# Patient Record
Sex: Male | Born: 1935 | Race: Black or African American | Hispanic: No | Marital: Married | Smoking: Former smoker
Health system: Southern US, Community
[De-identification: ages and names within clinical notes are randomized; demographics above are authoritative.]

## PROBLEM LIST (undated history)

## (undated) DIAGNOSIS — B019 Varicella without complication: Secondary | ICD-10-CM

## (undated) DIAGNOSIS — C61 Malignant neoplasm of prostate: Secondary | ICD-10-CM

## (undated) DIAGNOSIS — I639 Cerebral infarction, unspecified: Secondary | ICD-10-CM

## (undated) DIAGNOSIS — G20A1 Parkinson's disease without dyskinesia, without mention of fluctuations: Secondary | ICD-10-CM

## (undated) DIAGNOSIS — G20 Parkinson's disease: Secondary | ICD-10-CM

## (undated) HISTORY — DX: Malignant neoplasm of prostate: C61

## (undated) HISTORY — DX: Parkinson's disease: G20

## (undated) HISTORY — DX: Varicella without complication: B01.9

## (undated) HISTORY — PX: WISDOM TOOTH EXTRACTION: SHX21

## (undated) HISTORY — DX: Cerebral infarction, unspecified: I63.9

## (undated) HISTORY — DX: Parkinson's disease without dyskinesia, without mention of fluctuations: G20.A1

---

## 1998-06-24 ENCOUNTER — Ambulatory Visit (HOSPITAL_COMMUNITY): Admission: RE | Admit: 1998-06-24 | Discharge: 1998-06-24 | Payer: Self-pay | Admitting: Orthopaedic Surgery

## 1999-03-02 ENCOUNTER — Encounter: Admission: RE | Admit: 1999-03-02 | Discharge: 1999-05-31 | Payer: Self-pay | Admitting: Anesthesiology

## 2002-05-15 ENCOUNTER — Ambulatory Visit (HOSPITAL_COMMUNITY): Admission: RE | Admit: 2002-05-15 | Discharge: 2002-05-15 | Payer: Self-pay | Admitting: Gastroenterology

## 2009-12-12 HISTORY — PX: SINUS ENDO W/FUSION: SHX777

## 2010-12-12 DIAGNOSIS — I639 Cerebral infarction, unspecified: Secondary | ICD-10-CM

## 2010-12-12 HISTORY — DX: Cerebral infarction, unspecified: I63.9

## 2013-04-01 ENCOUNTER — Other Ambulatory Visit (HOSPITAL_COMMUNITY): Payer: Self-pay | Admitting: Urology

## 2013-04-01 DIAGNOSIS — C61 Malignant neoplasm of prostate: Secondary | ICD-10-CM

## 2013-04-18 ENCOUNTER — Encounter (HOSPITAL_COMMUNITY): Payer: Self-pay

## 2013-08-02 ENCOUNTER — Encounter: Payer: Self-pay | Admitting: Podiatry

## 2013-08-02 ENCOUNTER — Ambulatory Visit (INDEPENDENT_AMBULATORY_CARE_PROVIDER_SITE_OTHER): Payer: Medicare Other | Admitting: Podiatry

## 2013-08-02 DIAGNOSIS — B351 Tinea unguium: Secondary | ICD-10-CM

## 2013-08-02 DIAGNOSIS — M25579 Pain in unspecified ankle and joints of unspecified foot: Secondary | ICD-10-CM

## 2013-08-02 DIAGNOSIS — R609 Edema, unspecified: Secondary | ICD-10-CM | POA: Insufficient documentation

## 2013-08-02 NOTE — Progress Notes (Signed)
Subjective: 79 year old male presents accompanied by his wife, complaining of pain on both feet, swelling and thick toe nails.  Medication, allergies, past histories reviewed and noted.  Objective: Pedal edema pitting 2+ left foot and leg, 1+ right foot and leg. Hallux valgus with bunion L>R. Thick dystrophic and yellow nails x 10. Pedal pulses are palpable on DP and not palpable on PT.  Assessment: Edema lower limb L>R. Hypertrophic Mycotic nails x 10. Painful feet bilateral.  Plan: Advised to wear compression socks during the day. Debrided all nails. Return in 3 month or as needed.

## 2013-08-05 ENCOUNTER — Encounter: Payer: Self-pay | Admitting: Physician Assistant

## 2013-08-05 ENCOUNTER — Ambulatory Visit (INDEPENDENT_AMBULATORY_CARE_PROVIDER_SITE_OTHER): Payer: Medicare Other | Admitting: Physician Assistant

## 2013-08-05 VITALS — BP 142/88 | HR 60 | Temp 98.0°F | Resp 14 | Ht 66.0 in | Wt 165.0 lb

## 2013-08-05 DIAGNOSIS — C61 Malignant neoplasm of prostate: Secondary | ICD-10-CM | POA: Insufficient documentation

## 2013-08-05 DIAGNOSIS — G5622 Lesion of ulnar nerve, left upper limb: Secondary | ICD-10-CM

## 2013-08-05 DIAGNOSIS — G20A1 Parkinson's disease without dyskinesia, without mention of fluctuations: Secondary | ICD-10-CM

## 2013-08-05 DIAGNOSIS — G20 Parkinson's disease: Secondary | ICD-10-CM

## 2013-08-05 DIAGNOSIS — R609 Edema, unspecified: Secondary | ICD-10-CM

## 2013-08-05 DIAGNOSIS — G5620 Lesion of ulnar nerve, unspecified upper limb: Secondary | ICD-10-CM

## 2013-08-05 DIAGNOSIS — Z0000 Encounter for general adult medical examination without abnormal findings: Secondary | ICD-10-CM

## 2013-08-05 NOTE — Assessment & Plan Note (Signed)
Patient encouraged to wear compression stockings daily.  Elevate feet while resting.  Return if symptoms persist and worsen.

## 2013-08-05 NOTE — Assessment & Plan Note (Signed)
Managed by Neurologist -- Dr. Pearlie Oyster.  Continue current management

## 2013-08-05 NOTE — Patient Instructions (Signed)
Please come back this week to get your fasting labs.  No food or drink the morning before lab work.  I will call you with results.  Please wear compression hose daily to help with swelling.  Remember to elevate legs above the heart.  Avoid sleeping on left arm.  Please call if you need anything.   Edema Edema is an abnormal build-up of fluids in tissues. Because this is partly dependent on gravity (water flows to the lowest place), it is more common in the legs and thighs (lower extremities). It is also common in the looser tissues, like around the eyes. Painless swelling of the feet and ankles is common and increases as a person ages. It may affect both legs and may include the calves or even thighs. When squeezed, the fluid may move out of the affected area and may leave a dent for a few moments. CAUSES   Prolonged standing or sitting in one place for extended periods of time. Movement helps pump tissue fluid into the veins, and absence of movement prevents this, resulting in edema.  Varicose veins. The valves in the veins do not work as well as they should. This causes fluid to leak into the tissues.  Fluid and salt overload.  Injury, burn, or surgery to the leg, ankle, or foot, may damage veins and allow fluid to leak out.  Sunburn damages vessels. Leaky vessels allow fluid to go out into the sunburned tissues.  Allergies (from insect bites or stings, medications or chemicals) cause swelling by allowing vessels to become leaky.  Protein in the blood helps keep fluid in your vessels. Low protein, as in malnutrition, allows fluid to leak out.  Hormonal changes, including pregnancy and menstruation, cause fluid retention. This fluid may leak out of vessels and cause edema.  Medications that cause fluid retention. Examples are sex hormones, blood pressure medications, steroid treatment, or anti-depressants.  Some illnesses cause edema, especially heart failure, kidney disease, or liver  disease.  Surgery that cuts veins or lymph nodes, such as surgery done for the heart or for breast cancer, may result in edema. DIAGNOSIS  Your caregiver is usually easily able to determine what is causing your swelling (edema) by simply asking what is wrong (getting a history) and examining you (doing a physical). Sometimes x-rays, EKG (electrocardiogram or heart tracing), and blood work may be done to evaluate for underlying medical illness. TREATMENT  General treatment includes:  Leg elevation (or elevation of the affected body part).  Restriction of fluid intake.  Prevention of fluid overload.  Compression of the affected body part. Compression with elastic bandages or support stockings squeezes the tissues, preventing fluid from entering and forcing it back into the blood vessels.  Diuretics (also called water pills or fluid pills) pull fluid out of your body in the form of increased urination. These are effective in reducing the swelling, but can have side effects and must be used only under your caregiver's supervision. Diuretics are appropriate only for some types of edema. The specific treatment can be directed at any underlying causes discovered. Heart, liver, or kidney disease should be treated appropriately. HOME CARE INSTRUCTIONS   Elevate the legs (or affected body part) above the level of the heart, while lying down.  Avoid sitting or standing still for prolonged periods of time.  Avoid putting anything directly under the knees when lying down, and do not wear constricting clothing or garters on the upper legs.  Exercising the legs causes the fluid  to work back into the veins and lymphatic channels. This may help the swelling go down.  The pressure applied by elastic bandages or support stockings can help reduce ankle swelling.  A low-salt diet may help reduce fluid retention and decrease the ankle swelling.  Take any medications exactly as prescribed. SEEK MEDICAL  CARE IF:  Your edema is not responding to recommended treatments. SEEK IMMEDIATE MEDICAL CARE IF:   You develop shortness of breath or chest pain.  You cannot breathe when you lay down; or if, while lying down, you have to get up and go to the window to get your breath.  You are having increasing swelling without relief from treatment.  You develop a fever over 102 F (38.9 C).  You develop pain or redness in the areas that are swollen.  Tell your caregiver right away if you have gained 3 lb/1.4 kg in 1 day or 5 lb/2.3 kg in a week. MAKE SURE YOU:   Understand these instructions.  Will watch your condition.  Will get help right away if you are not doing well or get worse. Document Released: 11/28/2005 Document Revised: 05/29/2012 Document Reviewed: 07/16/2008 [facility] Patient Information 2014 [place], [place].

## 2013-08-05 NOTE — Assessment & Plan Note (Signed)
Mild, transient symptoms.  Patient asymptomatic at time of interview.  Patient encouraged to avoid sleeping on his left side.

## 2013-08-05 NOTE — Progress Notes (Signed)
Patient ID: Roger Cohen, male   DOB: 25-Nov-1928, 79 y.o.   MRN: [id number]   Patient presents to clinic today establish care.  Information was obtained from the patient and his wife, who was present for the interview and examination.  Patient states that he has had a tingling sensation in his middle/ring fingers of his left hand over the past few weeks.  States tingling sensation is associated with some numbness.  Tingling/Numbness only present when he wakes each morning.  Sensation goes away a few minutes after getting out of the bed.  States he sleeps on his back or left side each night.  Denies trauma to LUE.  Chronic Issues: (1) Parkinson's disease -- takes carbidopa-levodopa daily for symptom control.  Sees neurologist Dr. Pearlie Oyster who manages this condition  (2) Prostate cancer -- patient endorses prostate cancer diagnosed 7 years ago that is managed by his urologist.  Patient denies surgical intervention or therapy for cancer.    (3) Peripheral Edema -- patient endorses occasional leg swelling.  Denies history of heart failure.  Denies SOB, wheezing, orthopnea, PND.     Health Maintenance: Eye exam -- 2 years ago Dental -- no current care Immunizations -- Up-to-date   Past Medical History  Diagnosis Date  . Chicken pox   . Stroke 2012  . Parkinson's disease   . Prostate cancer     Current Outpatient Prescriptions on File Prior to Visit  Medication Sig Dispense Refill  . aspirin 81 MG tablet Take 81 mg by mouth daily.      . carbidopa-levodopa (SINEMET IR) 25-100 MG per tablet Take 2 tablets by mouth 3 (three) times daily.        No current facility-administered medications on file prior to visit.    Allergies  Allergen Reactions  . Penicillins Swelling    Family History  Problem Relation Age of Onset  . Stroke Sister   . Heart disease Mother   . Heart attack Mother 40    Deceased  . Hypertension Mother   . Dementia Mother   . Dementia Sister 29    Deceased     History   Social History  . Marital Status: Married    Spouse Name: N/A    Number of Children: N/A  . Years of Education: N/A   Social History Main Topics  . Smoking status: Former Smoker    Types: Cigars  . Smokeless tobacco: Former Neurosurgeon  . Alcohol Use: 1.2 oz/week    2 Cans of beer per week  . Drug Use: No  . Sexual Activity: None   Other Topics Concern  . None   Social History Narrative  . None   Review of Systems  Constitutional: Negative for fever, chills, weight loss and malaise/fatigue.  HENT: Positive for hearing loss and tinnitus.   Eyes: Negative for blurred vision, double vision and photophobia.  Respiratory: Negative for cough, hemoptysis, sputum production, shortness of breath and wheezing.   Cardiovascular: Negative for chest pain and palpitations.  Gastrointestinal: Positive for constipation. Negative for nausea, vomiting, abdominal pain, diarrhea, blood in stool and melena.  Genitourinary: Positive for frequency. Negative for dysuria, urgency, hematuria and flank pain.  Musculoskeletal: Negative for myalgias and back pain.  Neurological: Positive for tingling and sensory change. Negative for dizziness, seizures, loss of consciousness and headaches.  Endo/Heme/Allergies: Does not bruise/bleed easily.  Psychiatric/Behavioral: Negative for depression, suicidal ideas and substance abuse. The patient does not have insomnia.    Filed Vitals:  08/05/13 1109  BP: 142/88  Pulse: 60  Temp: 98 F (36.7 C)  Resp: 14    Physical Exam  Vitals reviewed. Constitutional: He is oriented to person, place, and time.  Underweight african [place] male in no acute distress, sitting on examination table  HENT:  Head: Normocephalic and atraumatic.  Right Ear: External ear normal.  Left Ear: External ear normal.  Nose: Nose normal.  Mouth/Throat: Oropharynx is clear and moist. No oropharyngeal exudate.  TMs WNL bilaterally  Eyes: Conjunctivae and EOM are normal.  Pupils are equal, round, and reactive to light.  Neck: Normal range of motion.  Cardiovascular: Normal rate, regular rhythm and normal heart sounds.   1 + pitting edema of bilateral lower extremities up to mid shin  Pulmonary/Chest: Effort normal and breath sounds normal. No respiratory distress. He has no wheezes. He has no rales. He exhibits no tenderness.  Abdominal: Soft. Bowel sounds are normal. There is no tenderness.  Lymphadenopathy:    He has no cervical adenopathy.  Neurological: He is alert and oriented to person, place, and time. He has normal reflexes. No cranial nerve deficit.  Skin: Skin is warm and dry. No rash noted.   Assessment/Plan: Edema Patient encouraged to wear compression stockings daily.  Elevate feet while resting.  Return if symptoms persist and worsen.  Parkinson's disease Managed by Neurologist -- Dr. Pearlie Oyster.  Continue current management  Prostate cancer Monitored by urologist.  Will defer PSA testing to Urology.  Patient denies surgical or pharmacological intervention.  Request patient obtain records for our viewing.  Cubital tunnel syndrome on left Mild, transient symptoms.  Patient asymptomatic at time of interview.  Patient encouraged to avoid sleeping on his left side.  Visit for preventive health examination History reviewed.  Will obtain fasting labs.

## 2013-08-05 NOTE — Assessment & Plan Note (Signed)
History reviewed.  Will obtain fasting labs.

## 2013-08-05 NOTE — Assessment & Plan Note (Signed)
Monitored by urologist.  Will defer PSA testing to Urology.  Patient denies surgical or pharmacological intervention.  Request patient obtain records for our viewing.

## 2013-08-10 LAB — BASIC METABOLIC PANEL
Calcium: 9.5 mg/dL (ref 8.4–10.5)
Creat: 1.03 mg/dL (ref 0.50–1.35)
Glucose, Bld: 94 mg/dL (ref 70–99)
Sodium: 140 mEq/L (ref 135–145)

## 2013-08-10 LAB — URINALYSIS, MICROSCOPIC ONLY
Casts: NONE SEEN
Crystals: NONE SEEN

## 2013-08-10 LAB — CBC WITH DIFFERENTIAL/PLATELET
Basophils Relative: 0 % (ref 0–1)
Eosinophils Absolute: 0.2 10*3/uL (ref 0.0–0.7)
Eosinophils Relative: 5 % (ref 0–5)
MCH: 27.6 pg (ref 26.0–34.0)
MCHC: 33.7 g/dL (ref 30.0–36.0)
MCV: 81.8 fL (ref 78.0–100.0)
Neutrophils Relative %: 39 % — ABNORMAL LOW (ref 43–77)
Platelets: 186 10*3/uL (ref 150–400)

## 2013-08-10 LAB — URINALYSIS, ROUTINE W REFLEX MICROSCOPIC
Bilirubin Urine: NEGATIVE
Ketones, ur: NEGATIVE mg/dL
Specific Gravity, Urine: 1.013 (ref 1.005–1.030)
pH: 5.5 (ref 5.0–8.0)

## 2013-08-10 LAB — HEPATIC FUNCTION PANEL
ALT: 17 U/L (ref 0–53)
AST: 18 U/L (ref 0–37)
Total Bilirubin: 0.7 mg/dL (ref 0.3–1.2)

## 2013-08-10 LAB — LIPID PANEL
Cholesterol: 145 mg/dL (ref 0–200)
HDL: 61 mg/dL (ref >39)
Triglycerides: 114 mg/dL (ref <150)

## 2013-08-10 LAB — HEMOGLOBIN A1C: Mean Plasma Glucose: 126 mg/dL — ABNORMAL HIGH (ref <117)

## 2013-08-10 LAB — TSH: TSH: 2.503 u[IU]/mL (ref 0.350–4.500)

## 2013-08-15 ENCOUNTER — Telehealth: Payer: Self-pay | Admitting: *Deleted

## 2013-08-15 NOTE — Telephone Encounter (Signed)
FYI: Patient [and wife on extension] informed, understood & agreed; they would like to p/u Medical Records from previous PCP [facility] charges $30 to pt, if we request them] & they will check with [facility] [Dr. Ranae Pila, as to if they charge for our request or not & let us know; they will also schedule 1-mth follow up at that time/SLS  Notes Recorded by Regis Bill, CMA on 08/14/2013 at 4:30 PM [facility] with contact name and number for return call RE: and further provider instructions/SLS  Notes Recorded by Regis Bill, CMA on 08/13/2013 at 4:58 PM [facility] with contact name and number for return call RE: results and further provider instructions/SLS

## 2013-08-15 NOTE — Telephone Encounter (Signed)
Message copied by Regis Bill on Thu Aug 15, 2013  5:01 PM ------      Message from: Marcelline Mates      Created: Tue Aug 13, 2013  2:47 PM       Please inform patient that there was evidence of blood in his urine via microscopy.  I want to see him in 1 month to recheck his urine.  He also shows some signs of dehydration.  Encourage increase in fluids throughout the day.  Please also ask patient/wife about what provider he sees for his prostate cancer and his old PCP.  We need to obtain records from this provider and his last PCP. ------

## 2014-02-11 ENCOUNTER — Ambulatory Visit: Payer: Medicare Other | Admitting: Nurse Practitioner

## 2014-02-12 ENCOUNTER — Ambulatory Visit (HOSPITAL_BASED_OUTPATIENT_CLINIC_OR_DEPARTMENT_OTHER)
Admission: RE | Admit: 2014-02-12 | Discharge: 2014-02-12 | Disposition: A | Discharge status: Home / Self Care | Payer: Medicare Other | Source: Ambulatory Visit | Attending: Physician Assistant | Admitting: Physician Assistant

## 2014-02-12 ENCOUNTER — Telehealth: Payer: Self-pay | Admitting: Physician Assistant

## 2014-02-12 ENCOUNTER — Ambulatory Visit (INDEPENDENT_AMBULATORY_CARE_PROVIDER_SITE_OTHER): Payer: Medicare Other | Admitting: Physician Assistant

## 2014-02-12 ENCOUNTER — Encounter: Payer: Self-pay | Admitting: Physician Assistant

## 2014-02-12 VITALS — BP 148/78 | HR 79 | Temp 98.3°F | Resp 16 | Ht 66.0 in | Wt 166.5 lb

## 2014-02-12 DIAGNOSIS — M25519 Pain in unspecified shoulder: Secondary | ICD-10-CM | POA: Insufficient documentation

## 2014-02-12 DIAGNOSIS — W19XXXA Unspecified fall, initial encounter: Secondary | ICD-10-CM

## 2014-02-12 DIAGNOSIS — M25579 Pain in unspecified ankle and joints of unspecified foot: Secondary | ICD-10-CM | POA: Insufficient documentation

## 2014-02-12 NOTE — Telephone Encounter (Signed)
Imaging is negative for fracture or dislocation.  Patient to continue care as discussed at today's visit.  He is to keep leg elevated at home. Attempt compression stocking.  If swelling not improving, we will need to order and [place] of his lower extremity to assess for venous insufficiency.

## 2014-02-12 NOTE — Telephone Encounter (Signed)
PLEASE USE CELL NUMBER TO CALL WITH X RAY RESULTS

## 2014-02-12 NOTE — Patient Instructions (Signed)
Please proceed downstairs to the imaging department for x-ray.  I will call you with your results.  Please apply moist heat to neck.  Can apply topical Icy Hot as well.  Tylenol or ibuprofen only if needed for pain.  Please wear compression stockings and keep leg elevated.

## 2014-02-12 NOTE — Progress Notes (Signed)
Pre visit review using our clinic review tool, if applicable. No additional management support is needed unless otherwise documented below in the visit note/SLS  

## 2014-02-12 NOTE — Telephone Encounter (Signed)
Patient Unavailable; spouse informed, understood & agreed/SLS

## 2014-02-20 ENCOUNTER — Telehealth: Payer: Self-pay | Admitting: *Deleted

## 2014-02-20 DIAGNOSIS — W19XXXA Unspecified fall, initial encounter: Secondary | ICD-10-CM | POA: Insufficient documentation

## 2014-02-20 NOTE — Progress Notes (Signed)
Patient presents to clinic today c/o pain in left shoulder and arm after falling sideways against the trunk of his car 5 days ago.  Patient endorses some decreased ROM of left shoulder secondary to stiffness.  Patient denies pain at present. Pain is only present at night when the patient is trying to sleep.  Patient also scrathed his left leg during the incident.  Patient did not fall to the ground.  No trauma to head. No lightheadedness or dizziness prior to incident.  Patient denies numbness or tingling of LUE.  Denies pallor or cyanosis to LUE.   Denies visible swelling. Patient does note some occasional pain in ankle.  Does think he might have injured it during the incident.  Denies decreased ROM.  Denies pallor, coolness, numbness or tingling.  Past Medical History  Diagnosis Date  . Chicken pox   . Stroke 2012  . Parkinson's disease   . Prostate cancer     Current Outpatient Prescriptions on File Prior to Visit  Medication Sig Dispense Refill  . aspirin 81 MG tablet Take 81 mg by mouth daily.      . carbidopa-levodopa (SINEMET IR) 25-100 MG per tablet Take 2 tablets by mouth 3 (three) times daily.       . Coenzyme Q10 (COQ10) 200 MG CAPS Take 200 mg by mouth daily.      . magnesium gluconate (MAGONATE) 500 MG tablet Take 500 mg by mouth 2 (two) times daily.      . Multiple Vitamin (MULTIVITAMIN) tablet Take 1 tablet by mouth daily.      . OMEGA 3 1000 MG CAPS Take 2,000 mg by mouth daily. Omega3 Fish Oil       No current facility-administered medications on file prior to visit.    Allergies  Allergen Reactions  . Penicillins Swelling    Family History  Problem Relation Age of Onset  . Stroke Sister   . Heart disease Mother   . Heart attack Mother 50    Deceased  . Hypertension Mother   . Dementia Mother   . Dementia Sister 55    Deceased    History   Social History  . Marital Status: Married    Spouse Name: N/A    Number of Children: N/A  . Years of Education: N/A    Social History Main Topics  . Smoking status: Former Smoker    Types: Cigars  . Smokeless tobacco: Former Systems developer  . Alcohol Use: 1.2 oz/week    2 Cans of beer per week  . Drug Use: No  . Sexual Activity: None   Other Topics Concern  . None   Social History Narrative  . None   Review of Systems - See HPI.  All other ROS are negative.  BP 148/78  Pulse 79  Temp(Src) 98.3 F (36.8 C) (Oral)  Resp 16  Ht 5\' 6"  (1.676 m)  Wt 166 lb 8 oz (75.524 kg)  BMI 26.89 kg/m2  SpO2 98%  Physical Exam  Vitals reviewed. Constitutional: He is oriented to person, place, and time and well-developed, well-nourished, and in no distress.  HENT:  Head: Normocephalic and atraumatic.  Eyes: Conjunctivae and EOM are normal. Pupils are equal, round, and reactive to light.  Neck: Neck supple.  Cardiovascular: Normal rate, regular rhythm and normal heart sounds.   Pulmonary/Chest: Effort normal and breath sounds normal.  Musculoskeletal:       Right shoulder: He exhibits no tenderness, no bony tenderness, no swelling and normal  pulse.       Left shoulder: He exhibits decreased range of motion and tenderness. He exhibits no bony tenderness, no swelling, no deformity, no laceration and normal strength.       Right elbow: Normal.      Right wrist: Normal.       Left ankle: He exhibits normal range of motion, no ecchymosis and normal pulse. No tenderness. Achilles tendon normal.  Some mild decrease in ROM of left shouldersecondary to pain. LLE with mild swelling of foot/ankle that has been chronic for patient.  Neurological: He is alert and oriented to person, place, and time. No cranial nerve deficit. Gait normal. GCS score is 15.  Skin: Skin is warm and dry.  Psychiatric: Affect normal.    Assessment/Plan: Fall Will obtain imaging due to fall -- DG L shoulder, DG L ankle/foot.  OTC pain reliever if needed.  Topical Aspercreme.  ROM exercises as tolerated pending normal imaging.  Warm compresses.   Call if symptoms do not continue to improve.  This will warrant further imaging and/or referral to Ortho.

## 2014-02-20 NOTE — Assessment & Plan Note (Signed)
Will obtain imaging due to fall -- DG L shoulder, DG L ankle/foot.  OTC pain reliever if needed.  Topical Aspercreme.  ROM exercises as tolerated pending normal imaging.  Warm compresses.  Call if symptoms do not continue to improve.  This will warrant further imaging and/or referral to Ortho.

## 2014-03-21 NOTE — Telephone Encounter (Signed)
Notes Recorded by Rockwell Germany, CMA on 08/15/2013 at 5:01 PM Patient [and wife on extension] informed, understood & agreed; they would like to p/u Medical Records from previous PCP [facility] charges $30 to pt, if we request them] & they will check with [facility] [Dr. Everlean Cherry, as to if they charge for our request or not/SLS SEE Phone note for further notations.  ------  Notes Recorded by Rockwell Germany, CMA on 08/14/2013 at 4:30 PM [facility] with contact name and number for return call RE: and further provider instructions/SLS  ------  Notes Recorded by Rockwell Germany, CMA on 08/13/2013 at 4:58 PM [facility] with contact name and number for return call RE: results and further provider instructions/SLS  ------  Notes Recorded by Leeanne Rio, PA-C on 08/13/2013 at 2:47 PM Please inform patient that there was evidence of blood in his urine via microscopy. I want to see him in 1 month to recheck his urine. He also shows some signs of dehydration. Encourage increase in fluids throughout the day. Please also ask patient/wife about what provider he sees for his prostate cancer and his old PCP. We need to obtain records from this provider and his last PCP.

## 2014-04-18 ENCOUNTER — Other Ambulatory Visit (HOSPITAL_COMMUNITY): Payer: Self-pay | Admitting: Urology

## 2014-04-18 DIAGNOSIS — C61 Malignant neoplasm of prostate: Secondary | ICD-10-CM

## 2014-04-25 ENCOUNTER — Encounter (HOSPITAL_COMMUNITY)
Admission: RE | Admit: 2014-04-25 | Discharge: 2014-04-25 | Disposition: A | Discharge status: Home / Self Care | Payer: Medicare Other | Source: Ambulatory Visit | Attending: Urology | Admitting: Urology

## 2014-04-25 ENCOUNTER — Encounter (HOSPITAL_COMMUNITY): Payer: Self-pay

## 2014-04-25 DIAGNOSIS — C61 Malignant neoplasm of prostate: Secondary | ICD-10-CM | POA: Insufficient documentation

## 2014-04-25 DIAGNOSIS — M899 Disorder of bone, unspecified: Secondary | ICD-10-CM | POA: Insufficient documentation

## 2014-04-25 DIAGNOSIS — M949 Disorder of cartilage, unspecified: Principal | ICD-10-CM

## 2014-04-25 MED ORDER — TECHNETIUM TC 99M MEDRONATE IV KIT
24.6000 | PACK | Freq: Once | INTRAVENOUS | Status: AC | PRN
  Administered 2014-04-25: 24.6 via INTRAVENOUS

## 2014-12-17 DIAGNOSIS — Z880 Allergy status to penicillin: Secondary | ICD-10-CM | POA: Diagnosis not present

## 2014-12-17 DIAGNOSIS — Z9889 Other specified postprocedural states: Secondary | ICD-10-CM | POA: Diagnosis not present

## 2014-12-17 DIAGNOSIS — G20 Parkinson's disease: Secondary | ICD-10-CM | POA: Diagnosis not present

## 2014-12-17 DIAGNOSIS — D1801 Hemangioma of skin and subcutaneous tissue: Secondary | ICD-10-CM | POA: Diagnosis not present

## 2014-12-17 DIAGNOSIS — J321 Chronic frontal sinusitis: Secondary | ICD-10-CM | POA: Diagnosis not present

## 2015-02-20 ENCOUNTER — Other Ambulatory Visit (HOSPITAL_COMMUNITY): Payer: Self-pay

## 2015-02-25 ENCOUNTER — Ambulatory Visit (HOSPITAL_COMMUNITY): Payer: Medicare Other | Attending: Cardiovascular Disease | Admitting: Cardiology

## 2015-02-25 ENCOUNTER — Other Ambulatory Visit (HOSPITAL_COMMUNITY): Payer: Self-pay | Admitting: Internal Medicine

## 2015-02-25 DIAGNOSIS — R0600 Dyspnea, unspecified: Secondary | ICD-10-CM

## 2015-02-25 NOTE — Progress Notes (Signed)
Echo performed. 

## 2015-12-15 DIAGNOSIS — G20 Parkinson's disease: Secondary | ICD-10-CM | POA: Diagnosis not present

## 2015-12-15 DIAGNOSIS — M5416 Radiculopathy, lumbar region: Secondary | ICD-10-CM | POA: Diagnosis not present

## 2015-12-15 DIAGNOSIS — M6281 Muscle weakness (generalized): Secondary | ICD-10-CM | POA: Diagnosis not present

## 2015-12-15 DIAGNOSIS — C61 Malignant neoplasm of prostate: Secondary | ICD-10-CM | POA: Diagnosis not present

## 2015-12-15 DIAGNOSIS — M109 Gout, unspecified: Secondary | ICD-10-CM | POA: Diagnosis not present

## 2015-12-15 DIAGNOSIS — Z9181 History of falling: Secondary | ICD-10-CM | POA: Diagnosis not present

## 2015-12-15 DIAGNOSIS — N390 Urinary tract infection, site not specified: Secondary | ICD-10-CM | POA: Diagnosis not present

## 2015-12-15 DIAGNOSIS — Z87440 Personal history of urinary (tract) infections: Secondary | ICD-10-CM | POA: Diagnosis not present

## 2015-12-18 DIAGNOSIS — C61 Malignant neoplasm of prostate: Secondary | ICD-10-CM | POA: Diagnosis not present

## 2015-12-18 DIAGNOSIS — Z9181 History of falling: Secondary | ICD-10-CM | POA: Diagnosis not present

## 2015-12-18 DIAGNOSIS — M5416 Radiculopathy, lumbar region: Secondary | ICD-10-CM | POA: Diagnosis not present

## 2015-12-18 DIAGNOSIS — M109 Gout, unspecified: Secondary | ICD-10-CM | POA: Diagnosis not present

## 2015-12-18 DIAGNOSIS — G20 Parkinson's disease: Secondary | ICD-10-CM | POA: Diagnosis not present

## 2015-12-18 DIAGNOSIS — Z87440 Personal history of urinary (tract) infections: Secondary | ICD-10-CM | POA: Diagnosis not present

## 2015-12-18 DIAGNOSIS — M6281 Muscle weakness (generalized): Secondary | ICD-10-CM | POA: Diagnosis not present

## 2015-12-22 DIAGNOSIS — Z87440 Personal history of urinary (tract) infections: Secondary | ICD-10-CM | POA: Diagnosis not present

## 2015-12-22 DIAGNOSIS — M109 Gout, unspecified: Secondary | ICD-10-CM | POA: Diagnosis not present

## 2015-12-22 DIAGNOSIS — G20 Parkinson's disease: Secondary | ICD-10-CM | POA: Diagnosis not present

## 2015-12-22 DIAGNOSIS — M6281 Muscle weakness (generalized): Secondary | ICD-10-CM | POA: Diagnosis not present

## 2015-12-22 DIAGNOSIS — C61 Malignant neoplasm of prostate: Secondary | ICD-10-CM | POA: Diagnosis not present

## 2015-12-22 DIAGNOSIS — M5416 Radiculopathy, lumbar region: Secondary | ICD-10-CM | POA: Diagnosis not present

## 2015-12-22 DIAGNOSIS — Z9181 History of falling: Secondary | ICD-10-CM | POA: Diagnosis not present

## 2015-12-23 ENCOUNTER — Encounter: Payer: Self-pay | Admitting: Internal Medicine

## 2015-12-23 ENCOUNTER — Non-Acute Institutional Stay (SKILLED_NURSING_FACILITY): Payer: Medicare Other | Admitting: Internal Medicine

## 2015-12-23 DIAGNOSIS — C61 Malignant neoplasm of prostate: Secondary | ICD-10-CM | POA: Diagnosis not present

## 2015-12-23 DIAGNOSIS — D638 Anemia in other chronic diseases classified elsewhere: Secondary | ICD-10-CM | POA: Diagnosis not present

## 2015-12-23 DIAGNOSIS — G20 Parkinson's disease: Secondary | ICD-10-CM | POA: Diagnosis not present

## 2015-12-23 DIAGNOSIS — Z87440 Personal history of urinary (tract) infections: Secondary | ICD-10-CM | POA: Diagnosis not present

## 2015-12-23 DIAGNOSIS — R262 Difficulty in walking, not elsewhere classified: Secondary | ICD-10-CM | POA: Diagnosis not present

## 2015-12-23 DIAGNOSIS — F0390 Unspecified dementia without behavioral disturbance: Secondary | ICD-10-CM | POA: Diagnosis not present

## 2015-12-23 DIAGNOSIS — R471 Dysarthria and anarthria: Secondary | ICD-10-CM | POA: Diagnosis not present

## 2015-12-23 DIAGNOSIS — R2681 Unsteadiness on feet: Secondary | ICD-10-CM | POA: Diagnosis not present

## 2015-12-23 DIAGNOSIS — I10 Essential (primary) hypertension: Secondary | ICD-10-CM | POA: Diagnosis not present

## 2015-12-23 DIAGNOSIS — M5416 Radiculopathy, lumbar region: Secondary | ICD-10-CM | POA: Diagnosis not present

## 2015-12-23 DIAGNOSIS — R278 Other lack of coordination: Secondary | ICD-10-CM | POA: Diagnosis not present

## 2015-12-23 DIAGNOSIS — M6281 Muscle weakness (generalized): Secondary | ICD-10-CM | POA: Diagnosis not present

## 2015-12-23 DIAGNOSIS — R1311 Dysphagia, oral phase: Secondary | ICD-10-CM | POA: Diagnosis not present

## 2015-12-23 DIAGNOSIS — Z9181 History of falling: Secondary | ICD-10-CM | POA: Diagnosis not present

## 2015-12-23 DIAGNOSIS — M109 Gout, unspecified: Secondary | ICD-10-CM | POA: Diagnosis not present

## 2015-12-23 NOTE — Progress Notes (Signed)
iaMRN: [id number] Name: Roger Cohen  Sex: male Age: 82 y.o. DOB: [birth year]  [place] #:[facility]  Facility/Room:101 Level Of Care: SNF Provider: Inocencio Homes D Emergency Contacts: Extended Emergency Contact Information Primary Emergency Contact: Garlington,Myrtle Address: [address]          [place], [place] [address] [place] Phone: [phone] Relation: Spouse  Code Status:   Allergies: Penicillins  Chief Complaint  Patient presents with  . New Admit To SNF    HPI: Patient is 82 y.o. male with parkinsons, s/p stroke and hx prostate and dementia who is being admitted to SNF from home at advice of PCP for 6 weeks of rehab with goal of getting pt back to home. He has a hx of falling and generalized weakness.he is being treated by a Haematologist and on multiple herbal medications. While at SNF pt will be followed for HTN, tx with garlic, parkinsons, tx with assortment of herbs and dementia, tx with multiple herbs.  Past Medical History  Diagnosis Date  . Chicken pox   . Stroke ([place]) 2012  . Parkinson's disease ([place])   . Prostate cancer [facility])     Past Surgical History  Procedure Laterality Date  . Sinus endo w/fusion  2011    Infection & Stents placed  . Wisdom tooth extraction        Medication List       This list is accurate as of: 12/23/15 11:59 PM.  Always use your most recent med list.               ascorbic acid 1000 MG tablet  Commonly known as:  VITAMIN C  Take 1,000 mg by mouth 2 (two) times daily.     Carnitine 250 MG Caps  Take 1 capsule by mouth 2 (two) times daily.     CoQ10 200 MG Caps  Take 200 mg by mouth daily.     folic acid Q000111Q MCG tablet  Commonly known as:  FOLVITE  Take 800 mcg by mouth daily.     Garlic [id number] MG Caps  Take 4 capsules by mouth 2 (two) times daily.     Ginger Root 550 MG Caps  Take 2 capsules by mouth daily.     magnesium gluconate 500 MG tablet  Commonly known as:  MAGONATE   Take 500 mg by mouth 2 (two) times daily.     multivitamin tablet  Take 1 tablet by mouth daily.     POTASSIUM CITRATE PO  Take 99 mg by mouth daily.     zinc gluconate 50 MG tablet  Take 50 mg by mouth daily.      Complete B complex - 2 tabs BID , verified by pharmacy Super omega 3 EPE/DHA with sesame liquors and olive extract-2 tabs po BID , verified by pharmacy 900 mg dailyTumeric with curcumin, verified by pharmacy L Threanine 100 mg daily, verified by pharmacy Meds pt is taking at home, not verified by pharmacy and will NOT be taking at [facility] mixed tocopherols. cognitex with psegnenolone and brain shields,  and memory protect  Meds ordered this encounter  Medications  . folic acid (FOLVITE) Q000111Q MCG tablet    Sig: Take 800 mcg by mouth daily.  . Carnitine 250 MG CAPS    Sig: Take 1 capsule by mouth 2 (two) times daily.  . Garlic [id number] MG CAPS    Sig: Take 4 capsules by mouth 2 (two) times daily.  Marland Kitchen  Ginger, Zingiber officinalis, (GINGER ROOT) 550 MG CAPS    Sig: Take 2 capsules by mouth daily.  Marland Kitchen ascorbic acid (VITAMIN C) 1000 MG tablet    Sig: Take 1,000 mg by mouth 2 (two) times daily.  Marland Kitchen POTASSIUM CITRATE PO    Sig: Take 99 mg by mouth daily.  Marland Kitchen zinc gluconate 50 MG tablet    Sig: Take 50 mg by mouth daily.   Complete B complex - 2 tabs BID , verified by pharmacy Super omega 3 EPE/DHA with sesame liquors and olive extract-2 tabs po BID , verified by pharmacy 900 mg dailyTumeric with curcumin, verified by pharmacy L Threanine 100 mg daily, verified by pharmacy Meds pt is taking at home, not verified by pharmacy and will NOT be taking at [facility] mixed tocopherols. cognitex with psegnenolone and brain shields,  and memory protect                                                                                                                                          There is no immunization history on file for this patient.  Social History  Substance Use  Topics  . Smoking status: Former Smoker    Types: Cigars  . Smokeless tobacco: Former Systems developer  . Alcohol Use: 1.2 oz/week    2 Cans of beer per week    Family history is + HTN, dementia    Review of Systems  DATA OBTAINED: from wife GENERAL:  no fevers, , appetite changes, + generalized weakness and falls SKIN: No itching, rash or wounds EYES: No eye pain, redness, discharge EARS: No earache, tinnitus, change in hearing NOSE: No congestion, drainage or bleeding  MOUTH/THROAT: No mouth or tooth pain, No sore throat RESPIRATORY: No cough, wheezing, SOB CARDIAC: No chest pain, palpitations,L  lower extremity edema for years GI: No abdominal pain, No N/V/D or constipation, No heartburn or reflux  GU: No dysuria, frequency or urgency, or incontinence  MUSCULOSKELETAL: No unrelieved bone/joint pain NEUROLOGIC: No headache, dizziness or focal weakness PSYCHIATRIC: No c/o anxiety or sadness   Filed Vitals:   12/23/15 1614  BP: 127/65  Pulse: 83  Temp: 97.3 F (36.3 C)  Resp: 18    SpO2 Readings from Last 1 Encounters:  02/12/14 98%        Physical Exam  GENERAL APPEARANCE: Alert, nonconversant,  No acute distress.  SKIN: No diaphoresis rash HEAD: Normocephalic, atraumatic  EYES: Conjunctiva/lids clear. Pupils round, reactive. EOMs intact.  EARS: External exam WNL, canals clear. Hearing grossly normal.  NOSE: No deformity or discharge.  MOUTH/THROAT: Lips w/o lesions  RESPIRATORY: Breathing is even, unlabored. Lung sounds are clear   CARDIOVASCULAR: Heart RRR no murmurs, rubs or gallops. LLE 1+ peripheral edema and cool to touch, for years, no wounds   GASTROINTESTINAL: Abdomen is soft, non-tender, not distended w/ normal bowel sounds. GENITOURINARY: Bladder non tender,  not distended  MUSCULOSKELETAL: No abnormal joints or musculature NEUROLOGIC:  Cranial nerves 2-12 grossly intact. Moves all extremities  PSYCHIATRIC: very demented, no behavioral issues  Patient Active  Problem List   Diagnosis Date Noted  . HTN (hypertension) 01/09/2016  . Dementia without behavioral disturbance 01/09/2016  . Anemia, chronic disease 01/09/2016  . Fall 02/20/2014  . Parkinson's disease ([place]) 08/05/2013  . Prostate cancer ([place]) 08/05/2013  . Cubital tunnel syndrome on left 08/05/2013  . Visit for preventive health examination 08/05/2013  . Edema 08/02/2013  . Onychomycosis 08/02/2013  . Pain in joint, ankle and foot 08/02/2013    CBC    Component Value Date/Time   WBC 3.3* 08/09/2013 0826   RBC 4.57 08/09/2013 0826   HGB 12.6* 08/09/2013 0826   HCT 37.4* 08/09/2013 0826   PLT 186 08/09/2013 0826   MCV 81.8 08/09/2013 0826   LYMPHSABS 1.5 08/09/2013 0826   MONOABS 0.4 08/09/2013 0826   EOSABS 0.2 08/09/2013 0826   BASOSABS 0.0 08/09/2013 0826    CMP     Component Value Date/Time   NA 140 08/09/2013 0826   K 4.1 08/09/2013 0826   CL 104 08/09/2013 0826   CO2 27 08/09/2013 0826   GLUCOSE 94 08/09/2013 0826   BUN 18 08/09/2013 0826   CREATININE 1.03 08/09/2013 0826   CALCIUM 9.5 08/09/2013 0826   PROT 6.8 08/09/2013 0826   ALBUMIN 4.0 08/09/2013 0826   AST 18 08/09/2013 0826   ALT 17 08/09/2013 0826   ALKPHOS 81 08/09/2013 0826   BILITOT 0.7 08/09/2013 0826    Lab Results  Component Value Date   HGBA1C 6.0* 08/09/2013     Nm Bone Scan Whole Body  04/25/2014  CLINICAL DATA:  Bone pain, prostate cancer. EXAM: NUCLEAR MEDICINE WHOLE BODY BONE SCAN TECHNIQUE: Whole body anterior and posterior images were obtained approximately 3 hours after intravenous injection of radiopharmaceutical. RADIOPHARMACEUTICALS:  24.6 mCi Technetium-99 MDP COMPARISON:  None. FINDINGS: Multiple foci of uptake are seen in the region of the right inguinal region in thigh most consistent with contamination. No abnormal osseous uptake is noted. IMPRESSION: No definite evidence of osseous metastases. Electronically Signed   By: Sabino Dick M.D.   On: 04/25/2014 15:06    Not  all labs, radiology exams or other studies done during hospitalization come through on my EPIC note; however they are reviewed by me.    Assessment and Plan  Parkinson's disease SNF - per wife pt takes numerous herbs that have helped his dx; the ones that were verified by pharmacy to be safe/not harmful will be continued  HTN (hypertension) SNF - BP is controlled; pt takes garlic for his BP which will be continued  Prostate cancer SNF - I searched EPIC; only info is no bony mets in 2014; wife denies problems as his herbal meds have been working  Dementia without behavioral disturbance SNF - pt clearly with advancing dementia; have continued all herbal meds that were verifed as safe/not harmful  Anemia, chronic disease SNF - last known Hb was 12.6 in 07/2013; pt is on folate and B vitamins as part of his hollistic regimen  [place] - this pt is here for rehab and has no acute problems and methods of treatment are outside of traditional medicine;I spoke with wife and did research thru EPIC for info;last labs were 07/2013 and CMP, TSH ,A1c were all normal; CBC, hb was a little low at 12.6; Pt had an ECHO done 02/2015 for dyspnea  which showed EF of [id number] with WNL systolic and diastolic function   Time spent > 45 min;> 50% of time with patient was spent reviewing records, labs, tests and studies, counseling and developing plan of care  Hennie Duos, MD

## 2015-12-27 ENCOUNTER — Encounter: Payer: Self-pay | Admitting: Internal Medicine

## 2016-01-09 ENCOUNTER — Encounter: Payer: Self-pay | Admitting: Internal Medicine

## 2016-01-09 DIAGNOSIS — D638 Anemia in other chronic diseases classified elsewhere: Secondary | ICD-10-CM | POA: Insufficient documentation

## 2016-01-09 DIAGNOSIS — I10 Essential (primary) hypertension: Secondary | ICD-10-CM | POA: Insufficient documentation

## 2016-01-09 DIAGNOSIS — F0390 Unspecified dementia without behavioral disturbance: Secondary | ICD-10-CM | POA: Insufficient documentation

## 2016-01-09 NOTE — Assessment & Plan Note (Signed)
SNF - per wife pt takes numerous herbs that have helped his dx; the ones that were verified by pharmacy to be safe/not harmful will be continued

## 2016-01-09 NOTE — Assessment & Plan Note (Signed)
SNF - last known Hb was 12.6 in 07/2013; pt is on folate and B vitamins as part of his hollistic regimen

## 2016-01-09 NOTE — Assessment & Plan Note (Signed)
SNF - I searched EPIC; only info is no bony mets in 2014; wife denies problems as his herbal meds have been working

## 2016-01-09 NOTE — Assessment & Plan Note (Signed)
SNF - BP is controlled; pt takes garlic for his BP which will be continued

## 2016-01-09 NOTE — Assessment & Plan Note (Signed)
SNF - pt clearly with advancing dementia; have continued all herbal meds that were verifed as safe/not harmful

## 2016-01-12 ENCOUNTER — Non-Acute Institutional Stay (SKILLED_NURSING_FACILITY): Payer: Medicare Other | Admitting: Internal Medicine

## 2016-01-12 ENCOUNTER — Encounter: Payer: Self-pay | Admitting: Internal Medicine

## 2016-01-12 DIAGNOSIS — C61 Malignant neoplasm of prostate: Secondary | ICD-10-CM | POA: Diagnosis not present

## 2016-01-12 DIAGNOSIS — G20 Parkinson's disease: Secondary | ICD-10-CM | POA: Diagnosis not present

## 2016-01-12 DIAGNOSIS — D638 Anemia in other chronic diseases classified elsewhere: Secondary | ICD-10-CM | POA: Diagnosis not present

## 2016-01-12 DIAGNOSIS — I10 Essential (primary) hypertension: Secondary | ICD-10-CM | POA: Diagnosis not present

## 2016-01-12 DIAGNOSIS — F0390 Unspecified dementia without behavioral disturbance: Secondary | ICD-10-CM

## 2016-01-12 NOTE — Progress Notes (Addendum)
MRN: [id number] Name: Roger Cohen  Sex: male Age: 82 y.o. DOB: 05/24/1928  [place] #: [facility] Facility/Room:101 Level Of Care: SNF Provider: Inocencio Homes D Emergency Contacts: Extended Emergency Contact Information Primary Emergency Contact: Garlington,Myrtle Address: [address]          [place],  [address] [place] Phone: [phone] Relation: Spouse  Code Status:   Allergies: Penicillins  Chief Complaint  Patient presents with  . Discharge Note    HPI: Patient is 82 y.o. male whoparkinsons, s/p stroke and hx prostate and dementia who is being admitted to SNF from home at advice of PCP for 6 weeks of rehab with goal of getting pt back to home. He has a hx of falling and generalized weakness.he is being treated by a Haematologist and on multiple herbal medications.Pt has achieved his goal and is now ready to be d/c to home.  Past Medical History  Diagnosis Date  . Chicken pox   . Stroke ([place]) 2012  . Parkinson's disease ([place])   . Prostate cancer [facility])     Past Surgical History  Procedure Laterality Date  . Sinus endo w/fusion  2011    Infection & Stents placed  . Wisdom tooth extraction        Medication List       This list is accurate as of: 01/12/16  1:40 PM.  Always use your most recent med list.               ascorbic acid 1000 MG tablet  Commonly known as:  VITAMIN C  Take 1,000 mg by mouth 2 (two) times daily.     CoQ10 200 MG Caps  Take 200 mg by mouth daily.     folic acid Q000111Q MCG tablet  Commonly known as:  FOLVITE  Take 800 mcg by mouth daily.     Garlic [id number] MG Caps  Take 4 capsules by mouth 2 (two) times daily.     Ginger Root 550 MG Caps  Take 2 capsules by mouth daily.     magnesium gluconate 500 MG tablet  Commonly known as:  MAGONATE  Take 500 mg by mouth 2 (two) times daily.     Magnesium Oxide (Antacid) 500 MG Caps  Take 1 capsule by mouth 2 (two) times daily.     multivitamin tablet   Take 1 tablet by mouth daily.     zinc gluconate 50 MG tablet  Take 50 mg by mouth daily.      Super omega 3 EPA/DHP with sesame and olive extract 2 po BID Tumeric with curcumin 500 mg 1 po daily Potassium Citrate 99 mg 1 po daily Acetal L carnitine arginate 250 mg 1 po BID    Meds ordered this encounter  Medications  . Magnesium Oxide, Antacid, 500 MG CAPS    Sig: Take 1 capsule by mouth 2 (two) times daily.     There is no immunization history on file for this patient.  Social History  Substance Use Topics  . Smoking status: Former Smoker    Types: Cigars  . Smokeless tobacco: Former Systems developer  . Alcohol Use: 1.2 oz/week    2 Cans of beer per week    Filed Vitals:   01/12/16 1304  BP: 125/76  Pulse: 68  Temp: 97.7 F (36.5 C)  Resp: 18    Physical Exam  GENERAL APPEARANCE: Alert. No acute distress.  HEENT: Unremarkable. RESPIRATORY: Breathing is even, unlabored. Lung  sounds are clear   CARDIOVASCULAR: Heart RRR no murmurs, rubs or gallops. No peripheral edema.  GASTROINTESTINAL: Abdomen is soft, non-tender, not distended w/ normal bowel sounds.  NEUROLOGIC: Cranial nerves 2-12 grossly intact. Moves all extremities  Patient Active Problem List   Diagnosis Date Noted  . HTN (hypertension) 01/09/2016  . Dementia without behavioral disturbance 01/09/2016  . Anemia, chronic disease 01/09/2016  . Fall 02/20/2014  . Parkinson's disease ([place]) 08/05/2013  . Prostate cancer ([place]) 08/05/2013  . Cubital tunnel syndrome on left 08/05/2013  . Visit for preventive health examination 08/05/2013  . Edema 08/02/2013  . Onychomycosis 08/02/2013  . Pain in joint, ankle and foot 08/02/2013    CBC    Component Value Date/Time   WBC 3.3* 08/09/2013 0826   RBC 4.57 08/09/2013 0826   HGB 12.6* 08/09/2013 0826   HCT 37.4* 08/09/2013 0826   PLT 186 08/09/2013 0826   MCV 81.8 08/09/2013 0826   LYMPHSABS 1.5 08/09/2013 0826   MONOABS 0.4 08/09/2013 0826   EOSABS 0.2  08/09/2013 0826   BASOSABS 0.0 08/09/2013 0826    CMP     Component Value Date/Time   NA 140 08/09/2013 0826   K 4.1 08/09/2013 0826   CL 104 08/09/2013 0826   CO2 27 08/09/2013 0826   GLUCOSE 94 08/09/2013 0826   BUN 18 08/09/2013 0826   CREATININE 1.03 08/09/2013 0826   CALCIUM 9.5 08/09/2013 0826   PROT 6.8 08/09/2013 0826   ALBUMIN 4.0 08/09/2013 0826   AST 18 08/09/2013 0826   ALT 17 08/09/2013 0826   ALKPHOS 81 08/09/2013 0826   BILITOT 0.7 08/09/2013 0826    Assessment and Plan  Pt is d/c to home with . No DME's needed. Rx's only of meds given here were written.  Hennie Duos, MD

## 2016-01-14 DIAGNOSIS — Z8673 Personal history of transient ischemic attack (TIA), and cerebral infarction without residual deficits: Secondary | ICD-10-CM | POA: Diagnosis not present

## 2016-01-14 DIAGNOSIS — M5416 Radiculopathy, lumbar region: Secondary | ICD-10-CM | POA: Diagnosis not present

## 2016-01-14 DIAGNOSIS — Z9181 History of falling: Secondary | ICD-10-CM | POA: Diagnosis not present

## 2016-01-14 DIAGNOSIS — C61 Malignant neoplasm of prostate: Secondary | ICD-10-CM | POA: Diagnosis not present

## 2016-01-14 DIAGNOSIS — I10 Essential (primary) hypertension: Secondary | ICD-10-CM | POA: Diagnosis not present

## 2016-01-14 DIAGNOSIS — M109 Gout, unspecified: Secondary | ICD-10-CM | POA: Diagnosis not present

## 2016-01-14 DIAGNOSIS — Z87440 Personal history of urinary (tract) infections: Secondary | ICD-10-CM | POA: Diagnosis not present

## 2016-01-14 DIAGNOSIS — G20 Parkinson's disease: Secondary | ICD-10-CM | POA: Diagnosis not present

## 2016-01-14 DIAGNOSIS — Z87891 Personal history of nicotine dependence: Secondary | ICD-10-CM | POA: Diagnosis not present

## 2016-01-14 DIAGNOSIS — M6281 Muscle weakness (generalized): Secondary | ICD-10-CM | POA: Diagnosis not present

## 2016-01-19 DIAGNOSIS — M5416 Radiculopathy, lumbar region: Secondary | ICD-10-CM | POA: Diagnosis not present

## 2016-01-19 DIAGNOSIS — Z23 Encounter for immunization: Secondary | ICD-10-CM | POA: Diagnosis not present

## 2016-01-19 DIAGNOSIS — C61 Malignant neoplasm of prostate: Secondary | ICD-10-CM | POA: Diagnosis not present

## 2016-01-19 DIAGNOSIS — D649 Anemia, unspecified: Secondary | ICD-10-CM | POA: Diagnosis not present

## 2016-01-20 DIAGNOSIS — G20 Parkinson's disease: Secondary | ICD-10-CM | POA: Diagnosis not present

## 2016-01-20 DIAGNOSIS — I10 Essential (primary) hypertension: Secondary | ICD-10-CM | POA: Diagnosis not present

## 2016-01-20 DIAGNOSIS — Z8673 Personal history of transient ischemic attack (TIA), and cerebral infarction without residual deficits: Secondary | ICD-10-CM | POA: Diagnosis not present

## 2016-01-20 DIAGNOSIS — M6281 Muscle weakness (generalized): Secondary | ICD-10-CM | POA: Diagnosis not present

## 2016-01-20 DIAGNOSIS — M5416 Radiculopathy, lumbar region: Secondary | ICD-10-CM | POA: Diagnosis not present

## 2016-01-20 DIAGNOSIS — Z87891 Personal history of nicotine dependence: Secondary | ICD-10-CM | POA: Diagnosis not present

## 2016-01-20 DIAGNOSIS — Z87440 Personal history of urinary (tract) infections: Secondary | ICD-10-CM | POA: Diagnosis not present

## 2016-01-20 DIAGNOSIS — Z9181 History of falling: Secondary | ICD-10-CM | POA: Diagnosis not present

## 2016-01-20 DIAGNOSIS — C61 Malignant neoplasm of prostate: Secondary | ICD-10-CM | POA: Diagnosis not present

## 2016-01-20 DIAGNOSIS — M109 Gout, unspecified: Secondary | ICD-10-CM | POA: Diagnosis not present

## 2016-01-21 DIAGNOSIS — G20 Parkinson's disease: Secondary | ICD-10-CM | POA: Diagnosis not present

## 2016-01-21 DIAGNOSIS — C61 Malignant neoplasm of prostate: Secondary | ICD-10-CM | POA: Diagnosis not present

## 2016-01-21 DIAGNOSIS — M5416 Radiculopathy, lumbar region: Secondary | ICD-10-CM | POA: Diagnosis not present

## 2016-01-21 DIAGNOSIS — Z87891 Personal history of nicotine dependence: Secondary | ICD-10-CM | POA: Diagnosis not present

## 2016-01-21 DIAGNOSIS — Z9181 History of falling: Secondary | ICD-10-CM | POA: Diagnosis not present

## 2016-01-21 DIAGNOSIS — M6281 Muscle weakness (generalized): Secondary | ICD-10-CM | POA: Diagnosis not present

## 2016-01-21 DIAGNOSIS — Z87440 Personal history of urinary (tract) infections: Secondary | ICD-10-CM | POA: Diagnosis not present

## 2016-01-21 DIAGNOSIS — I10 Essential (primary) hypertension: Secondary | ICD-10-CM | POA: Diagnosis not present

## 2016-01-21 DIAGNOSIS — M109 Gout, unspecified: Secondary | ICD-10-CM | POA: Diagnosis not present

## 2016-01-21 DIAGNOSIS — Z8673 Personal history of transient ischemic attack (TIA), and cerebral infarction without residual deficits: Secondary | ICD-10-CM | POA: Diagnosis not present

## 2016-01-22 DIAGNOSIS — Z8673 Personal history of transient ischemic attack (TIA), and cerebral infarction without residual deficits: Secondary | ICD-10-CM | POA: Diagnosis not present

## 2016-01-22 DIAGNOSIS — M109 Gout, unspecified: Secondary | ICD-10-CM | POA: Diagnosis not present

## 2016-01-22 DIAGNOSIS — Z87440 Personal history of urinary (tract) infections: Secondary | ICD-10-CM | POA: Diagnosis not present

## 2016-01-22 DIAGNOSIS — M6281 Muscle weakness (generalized): Secondary | ICD-10-CM | POA: Diagnosis not present

## 2016-01-22 DIAGNOSIS — Z9181 History of falling: Secondary | ICD-10-CM | POA: Diagnosis not present

## 2016-01-22 DIAGNOSIS — C61 Malignant neoplasm of prostate: Secondary | ICD-10-CM | POA: Diagnosis not present

## 2016-01-22 DIAGNOSIS — M5416 Radiculopathy, lumbar region: Secondary | ICD-10-CM | POA: Diagnosis not present

## 2016-01-22 DIAGNOSIS — I10 Essential (primary) hypertension: Secondary | ICD-10-CM | POA: Diagnosis not present

## 2016-01-22 DIAGNOSIS — Z87891 Personal history of nicotine dependence: Secondary | ICD-10-CM | POA: Diagnosis not present

## 2016-01-22 DIAGNOSIS — G20 Parkinson's disease: Secondary | ICD-10-CM | POA: Diagnosis not present

## 2016-01-27 DIAGNOSIS — M109 Gout, unspecified: Secondary | ICD-10-CM | POA: Diagnosis not present

## 2016-01-27 DIAGNOSIS — M5416 Radiculopathy, lumbar region: Secondary | ICD-10-CM | POA: Diagnosis not present

## 2016-01-27 DIAGNOSIS — G20 Parkinson's disease: Secondary | ICD-10-CM | POA: Diagnosis not present

## 2016-01-27 DIAGNOSIS — Z9181 History of falling: Secondary | ICD-10-CM | POA: Diagnosis not present

## 2016-01-27 DIAGNOSIS — Z87891 Personal history of nicotine dependence: Secondary | ICD-10-CM | POA: Diagnosis not present

## 2016-01-27 DIAGNOSIS — Z87440 Personal history of urinary (tract) infections: Secondary | ICD-10-CM | POA: Diagnosis not present

## 2016-01-27 DIAGNOSIS — Z8673 Personal history of transient ischemic attack (TIA), and cerebral infarction without residual deficits: Secondary | ICD-10-CM | POA: Diagnosis not present

## 2016-01-27 DIAGNOSIS — I10 Essential (primary) hypertension: Secondary | ICD-10-CM | POA: Diagnosis not present

## 2016-01-27 DIAGNOSIS — M6281 Muscle weakness (generalized): Secondary | ICD-10-CM | POA: Diagnosis not present

## 2016-01-27 DIAGNOSIS — C61 Malignant neoplasm of prostate: Secondary | ICD-10-CM | POA: Diagnosis not present

## 2016-01-28 DIAGNOSIS — M5416 Radiculopathy, lumbar region: Secondary | ICD-10-CM | POA: Diagnosis not present

## 2016-01-28 DIAGNOSIS — Z87891 Personal history of nicotine dependence: Secondary | ICD-10-CM | POA: Diagnosis not present

## 2016-01-28 DIAGNOSIS — M109 Gout, unspecified: Secondary | ICD-10-CM | POA: Diagnosis not present

## 2016-01-28 DIAGNOSIS — N390 Urinary tract infection, site not specified: Secondary | ICD-10-CM | POA: Diagnosis not present

## 2016-01-28 DIAGNOSIS — Z8673 Personal history of transient ischemic attack (TIA), and cerebral infarction without residual deficits: Secondary | ICD-10-CM | POA: Diagnosis not present

## 2016-01-28 DIAGNOSIS — G20 Parkinson's disease: Secondary | ICD-10-CM | POA: Diagnosis not present

## 2016-01-28 DIAGNOSIS — M6281 Muscle weakness (generalized): Secondary | ICD-10-CM | POA: Diagnosis not present

## 2016-01-28 DIAGNOSIS — Z9181 History of falling: Secondary | ICD-10-CM | POA: Diagnosis not present

## 2016-01-28 DIAGNOSIS — C61 Malignant neoplasm of prostate: Secondary | ICD-10-CM | POA: Diagnosis not present

## 2016-01-28 DIAGNOSIS — Z0000 Encounter for general adult medical examination without abnormal findings: Secondary | ICD-10-CM | POA: Diagnosis not present

## 2016-01-28 DIAGNOSIS — I10 Essential (primary) hypertension: Secondary | ICD-10-CM | POA: Diagnosis not present

## 2016-01-28 DIAGNOSIS — Z87440 Personal history of urinary (tract) infections: Secondary | ICD-10-CM | POA: Diagnosis not present

## 2016-01-29 DIAGNOSIS — Z8673 Personal history of transient ischemic attack (TIA), and cerebral infarction without residual deficits: Secondary | ICD-10-CM | POA: Diagnosis not present

## 2016-01-29 DIAGNOSIS — Z87891 Personal history of nicotine dependence: Secondary | ICD-10-CM | POA: Diagnosis not present

## 2016-01-29 DIAGNOSIS — Z87440 Personal history of urinary (tract) infections: Secondary | ICD-10-CM | POA: Diagnosis not present

## 2016-01-29 DIAGNOSIS — M109 Gout, unspecified: Secondary | ICD-10-CM | POA: Diagnosis not present

## 2016-01-29 DIAGNOSIS — M5416 Radiculopathy, lumbar region: Secondary | ICD-10-CM | POA: Diagnosis not present

## 2016-01-29 DIAGNOSIS — I10 Essential (primary) hypertension: Secondary | ICD-10-CM | POA: Diagnosis not present

## 2016-01-29 DIAGNOSIS — M6281 Muscle weakness (generalized): Secondary | ICD-10-CM | POA: Diagnosis not present

## 2016-01-29 DIAGNOSIS — C61 Malignant neoplasm of prostate: Secondary | ICD-10-CM | POA: Diagnosis not present

## 2016-01-29 DIAGNOSIS — Z9181 History of falling: Secondary | ICD-10-CM | POA: Diagnosis not present

## 2016-01-29 DIAGNOSIS — G20 Parkinson's disease: Secondary | ICD-10-CM | POA: Diagnosis not present

## 2016-02-01 DIAGNOSIS — C61 Malignant neoplasm of prostate: Secondary | ICD-10-CM | POA: Diagnosis not present

## 2016-02-01 DIAGNOSIS — Z9181 History of falling: Secondary | ICD-10-CM | POA: Diagnosis not present

## 2016-02-01 DIAGNOSIS — G20 Parkinson's disease: Secondary | ICD-10-CM | POA: Diagnosis not present

## 2016-02-01 DIAGNOSIS — Z8673 Personal history of transient ischemic attack (TIA), and cerebral infarction without residual deficits: Secondary | ICD-10-CM | POA: Diagnosis not present

## 2016-02-01 DIAGNOSIS — M6281 Muscle weakness (generalized): Secondary | ICD-10-CM | POA: Diagnosis not present

## 2016-02-01 DIAGNOSIS — M109 Gout, unspecified: Secondary | ICD-10-CM | POA: Diagnosis not present

## 2016-02-01 DIAGNOSIS — Z87891 Personal history of nicotine dependence: Secondary | ICD-10-CM | POA: Diagnosis not present

## 2016-02-01 DIAGNOSIS — M5416 Radiculopathy, lumbar region: Secondary | ICD-10-CM | POA: Diagnosis not present

## 2016-02-01 DIAGNOSIS — I10 Essential (primary) hypertension: Secondary | ICD-10-CM | POA: Diagnosis not present

## 2016-02-01 DIAGNOSIS — Z87440 Personal history of urinary (tract) infections: Secondary | ICD-10-CM | POA: Diagnosis not present

## 2016-02-02 DIAGNOSIS — M6281 Muscle weakness (generalized): Secondary | ICD-10-CM | POA: Diagnosis not present

## 2016-02-02 DIAGNOSIS — Z9181 History of falling: Secondary | ICD-10-CM | POA: Diagnosis not present

## 2016-02-02 DIAGNOSIS — M5416 Radiculopathy, lumbar region: Secondary | ICD-10-CM | POA: Diagnosis not present

## 2016-02-02 DIAGNOSIS — C61 Malignant neoplasm of prostate: Secondary | ICD-10-CM | POA: Diagnosis not present

## 2016-02-02 DIAGNOSIS — I10 Essential (primary) hypertension: Secondary | ICD-10-CM | POA: Diagnosis not present

## 2016-02-02 DIAGNOSIS — G20 Parkinson's disease: Secondary | ICD-10-CM | POA: Diagnosis not present

## 2016-02-02 DIAGNOSIS — Z87440 Personal history of urinary (tract) infections: Secondary | ICD-10-CM | POA: Diagnosis not present

## 2016-02-02 DIAGNOSIS — Z8673 Personal history of transient ischemic attack (TIA), and cerebral infarction without residual deficits: Secondary | ICD-10-CM | POA: Diagnosis not present

## 2016-02-02 DIAGNOSIS — Z87891 Personal history of nicotine dependence: Secondary | ICD-10-CM | POA: Diagnosis not present

## 2016-02-02 DIAGNOSIS — M109 Gout, unspecified: Secondary | ICD-10-CM | POA: Diagnosis not present

## 2016-02-04 DIAGNOSIS — I10 Essential (primary) hypertension: Secondary | ICD-10-CM | POA: Diagnosis not present

## 2016-02-04 DIAGNOSIS — Z87440 Personal history of urinary (tract) infections: Secondary | ICD-10-CM | POA: Diagnosis not present

## 2016-02-04 DIAGNOSIS — M5416 Radiculopathy, lumbar region: Secondary | ICD-10-CM | POA: Diagnosis not present

## 2016-02-04 DIAGNOSIS — Z8673 Personal history of transient ischemic attack (TIA), and cerebral infarction without residual deficits: Secondary | ICD-10-CM | POA: Diagnosis not present

## 2016-02-04 DIAGNOSIS — C61 Malignant neoplasm of prostate: Secondary | ICD-10-CM | POA: Diagnosis not present

## 2016-02-04 DIAGNOSIS — Z9181 History of falling: Secondary | ICD-10-CM | POA: Diagnosis not present

## 2016-02-04 DIAGNOSIS — M109 Gout, unspecified: Secondary | ICD-10-CM | POA: Diagnosis not present

## 2016-02-04 DIAGNOSIS — G20 Parkinson's disease: Secondary | ICD-10-CM | POA: Diagnosis not present

## 2016-02-04 DIAGNOSIS — M6281 Muscle weakness (generalized): Secondary | ICD-10-CM | POA: Diagnosis not present

## 2016-02-04 DIAGNOSIS — Z87891 Personal history of nicotine dependence: Secondary | ICD-10-CM | POA: Diagnosis not present

## 2016-02-05 DIAGNOSIS — Z9181 History of falling: Secondary | ICD-10-CM | POA: Diagnosis not present

## 2016-02-05 DIAGNOSIS — M6281 Muscle weakness (generalized): Secondary | ICD-10-CM | POA: Diagnosis not present

## 2016-02-05 DIAGNOSIS — I10 Essential (primary) hypertension: Secondary | ICD-10-CM | POA: Diagnosis not present

## 2016-02-05 DIAGNOSIS — C61 Malignant neoplasm of prostate: Secondary | ICD-10-CM | POA: Diagnosis not present

## 2016-02-05 DIAGNOSIS — M109 Gout, unspecified: Secondary | ICD-10-CM | POA: Diagnosis not present

## 2016-02-05 DIAGNOSIS — Z87440 Personal history of urinary (tract) infections: Secondary | ICD-10-CM | POA: Diagnosis not present

## 2016-02-05 DIAGNOSIS — Z87891 Personal history of nicotine dependence: Secondary | ICD-10-CM | POA: Diagnosis not present

## 2016-02-05 DIAGNOSIS — G20 Parkinson's disease: Secondary | ICD-10-CM | POA: Diagnosis not present

## 2016-02-05 DIAGNOSIS — Z8673 Personal history of transient ischemic attack (TIA), and cerebral infarction without residual deficits: Secondary | ICD-10-CM | POA: Diagnosis not present

## 2016-02-05 DIAGNOSIS — M5416 Radiculopathy, lumbar region: Secondary | ICD-10-CM | POA: Diagnosis not present

## 2016-02-08 DIAGNOSIS — Z87440 Personal history of urinary (tract) infections: Secondary | ICD-10-CM | POA: Diagnosis not present

## 2016-02-08 DIAGNOSIS — Z8673 Personal history of transient ischemic attack (TIA), and cerebral infarction without residual deficits: Secondary | ICD-10-CM | POA: Diagnosis not present

## 2016-02-08 DIAGNOSIS — M6281 Muscle weakness (generalized): Secondary | ICD-10-CM | POA: Diagnosis not present

## 2016-02-08 DIAGNOSIS — C61 Malignant neoplasm of prostate: Secondary | ICD-10-CM | POA: Diagnosis not present

## 2016-02-08 DIAGNOSIS — Z9181 History of falling: Secondary | ICD-10-CM | POA: Diagnosis not present

## 2016-02-08 DIAGNOSIS — Z87891 Personal history of nicotine dependence: Secondary | ICD-10-CM | POA: Diagnosis not present

## 2016-02-08 DIAGNOSIS — M5416 Radiculopathy, lumbar region: Secondary | ICD-10-CM | POA: Diagnosis not present

## 2016-02-08 DIAGNOSIS — M109 Gout, unspecified: Secondary | ICD-10-CM | POA: Diagnosis not present

## 2016-02-08 DIAGNOSIS — I10 Essential (primary) hypertension: Secondary | ICD-10-CM | POA: Diagnosis not present

## 2016-02-08 DIAGNOSIS — G20 Parkinson's disease: Secondary | ICD-10-CM | POA: Diagnosis not present

## 2017-12-26 ENCOUNTER — Encounter: Payer: Self-pay | Admitting: Podiatry

## 2017-12-26 ENCOUNTER — Ambulatory Visit: Payer: Medicare HMO | Admitting: Podiatry

## 2017-12-26 DIAGNOSIS — S9031XA Contusion of right foot, initial encounter: Secondary | ICD-10-CM | POA: Diagnosis not present

## 2017-12-26 DIAGNOSIS — R600 Localized edema: Secondary | ICD-10-CM | POA: Diagnosis not present

## 2017-12-26 NOTE — Patient Instructions (Signed)
Seen for lesion on 3rd toe right. Noted of dry old blood under skin and edema in lower limbs. Reviewed findings and emphasis on fluid management. Return as needed.

## 2017-12-26 NOTE — Progress Notes (Signed)
SUBJECTIVE: 84 y.o. year old male presents via wheel chair. Patient is not able to verbalize clearly. Accompanied wife stated that he has a spot of his toes.  Has atypical parkinson's.  Review of Systems  Constitutional: Negative.   HENT: Negative.   Eyes: Negative.   Respiratory: Negative.   Cardiovascular: Negative.   Gastrointestinal: Negative.   Genitourinary: Negative.   Musculoskeletal: Negative.   Skin: Negative.   Neurological:       Stiffness due to Atypical Parkinson's.     OBJECTIVE: DERMATOLOGIC EXAMINATION: No open skin lesions.  Positive of dark dry blood, 0.5 cm in diameter at distal end of 3rd toe right.  VASCULAR EXAMINATION OF LOWER LIMBS: All pedal pulses are faintly palpable in DP. Not palpable PT. Capillary Filling times within 3 seconds in all digits.  Mild lower limb edema bilateral. Temperature gradient from tibial crest to dorsum of foot is within normal bilateral.  NEUROLOGIC EXAMINATION OF THE LOWER LIMBS: All epicritic and tactile sensations grossly intact.  MUSCULOSKELETAL EXAMINATION: Mild Hallux valgus with irritated bunion site bilateral.   ASSESSMENT: Injury to skin distal end 3rd toe with superficial hematoma, dry. Edema lower limb.  PLAN: Reviewed findings with wife. Monitor for any change in skin lesion. Reviewed edema management, fluid pills and compression socks.

## 2017-12-28 ENCOUNTER — Ambulatory Visit: Payer: Medicare Other | Admitting: Podiatry

## 2018-04-18 ENCOUNTER — Emergency Department (HOSPITAL_COMMUNITY)
Admission: EM | Admit: 2018-04-18 | Discharge: 2018-04-18 | Disposition: A | Discharge status: Home / Self Care | Payer: Medicare HMO | Attending: Emergency Medicine | Admitting: Emergency Medicine

## 2018-04-18 ENCOUNTER — Emergency Department (HOSPITAL_COMMUNITY): Payer: Medicare HMO

## 2018-04-18 ENCOUNTER — Encounter: Payer: Self-pay | Admitting: Family Medicine

## 2018-04-18 ENCOUNTER — Ambulatory Visit (INDEPENDENT_AMBULATORY_CARE_PROVIDER_SITE_OTHER): Payer: Medicare HMO | Admitting: Family Medicine

## 2018-04-18 ENCOUNTER — Encounter (HOSPITAL_COMMUNITY): Payer: Self-pay

## 2018-04-18 DIAGNOSIS — F0390 Unspecified dementia without behavioral disturbance: Secondary | ICD-10-CM | POA: Diagnosis not present

## 2018-04-18 DIAGNOSIS — I10 Essential (primary) hypertension: Secondary | ICD-10-CM | POA: Diagnosis not present

## 2018-04-18 DIAGNOSIS — S4992XA Unspecified injury of left shoulder and upper arm, initial encounter: Secondary | ICD-10-CM | POA: Diagnosis present

## 2018-04-18 DIAGNOSIS — Z87891 Personal history of nicotine dependence: Secondary | ICD-10-CM | POA: Diagnosis not present

## 2018-04-18 DIAGNOSIS — Y929 Unspecified place or not applicable: Secondary | ICD-10-CM | POA: Insufficient documentation

## 2018-04-18 DIAGNOSIS — G20 Parkinson's disease: Secondary | ICD-10-CM | POA: Diagnosis not present

## 2018-04-18 DIAGNOSIS — S43005A Unspecified dislocation of left shoulder joint, initial encounter: Secondary | ICD-10-CM | POA: Diagnosis not present

## 2018-04-18 DIAGNOSIS — Y939 Activity, unspecified: Secondary | ICD-10-CM | POA: Diagnosis not present

## 2018-04-18 DIAGNOSIS — Z79899 Other long term (current) drug therapy: Secondary | ICD-10-CM | POA: Diagnosis not present

## 2018-04-18 DIAGNOSIS — Y999 Unspecified external cause status: Secondary | ICD-10-CM | POA: Diagnosis not present

## 2018-04-18 DIAGNOSIS — Z8546 Personal history of malignant neoplasm of prostate: Secondary | ICD-10-CM | POA: Diagnosis not present

## 2018-04-18 DIAGNOSIS — W050XXA Fall from non-moving wheelchair, initial encounter: Secondary | ICD-10-CM | POA: Insufficient documentation

## 2018-04-18 DIAGNOSIS — M25512 Pain in left shoulder: Secondary | ICD-10-CM

## 2018-04-18 MED ORDER — ETOMIDATE 2 MG/ML IV SOLN
10.0000 mg | Freq: Once | INTRAVENOUS | Status: AC
  Administered 2018-04-18: 10 mg via INTRAVENOUS
  Filled 2018-04-18: qty 10

## 2018-04-18 MED ORDER — MIDAZOLAM HCL 2 MG/2ML IJ SOLN: 1.0000 mg | Freq: Once | INTRAMUSCULAR | Status: DC

## 2018-04-18 MED ORDER — MIDAZOLAM HCL 5 MG/ML IJ SOLN: 1.0000 mg | Freq: Once | INTRAMUSCULAR | Status: DC

## 2018-04-18 MED ORDER — LIDOCAINE HCL (PF) 1 % IJ SOLN
5.0000 mL | Freq: Once | INTRAMUSCULAR | Status: AC
  Administered 2018-04-18: 5 mL
  Filled 2018-04-18: qty 5

## 2018-04-18 NOTE — ED Provider Notes (Signed)
[facility] EMERGENCY DEPARTMENT Provider Note   CSN: [id number] Arrival date & time: 04/18/18  1152   History   Chief Complaint No chief complaint on file.   HPI Roger Cohen is a 84 y.o. male with a past medical history of Parkinson's disease, dementia, HTN, CVA, remote prostate ca who presented with shoulder abnormality.   Patient and wife presented to outpatient clinic with complaints of left shoulder appearing out of place.  Patient's wife states she noticed his arm did not appear normal 2-3 weeks ago.  She is unsure of a specific inciting incident but notes that he falls regularly, about once a week.  He is in a wheelchair with falls occurring with transfers from bed or car.  No other injuries noted.  No complaints of pain until attempted reduction at outpatient office visit. Wife did notice he was not using his L arm as much at home.   Past Medical History:  Diagnosis Date  . Chicken pox   . Parkinson's disease ([place])   . Prostate cancer ([place])   . Stroke [facility]) 2012    Patient Active Problem List   Diagnosis Date Noted  . Left shoulder pain 04/18/2018  . HTN (hypertension) 01/09/2016  . Dementia without behavioral disturbance 01/09/2016  . Anemia, chronic disease 01/09/2016  . Fall 02/20/2014  . Parkinson's disease ([place]) 08/05/2013  . Prostate cancer ([place]) 08/05/2013  . Cubital tunnel syndrome on left 08/05/2013  . Visit for preventive health examination 08/05/2013  . Edema 08/02/2013  . Onychomycosis 08/02/2013  . Pain in joint, ankle and foot 08/02/2013    Past Surgical History:  Procedure Laterality Date  . SINUS ENDO W/FUSION  2011   Infection & Stents placed  . WISDOM TOOTH EXTRACTION          Home Medications    Prior to Admission medications   Medication Sig Start Date End Date Taking? Authorizing Provider  ascorbic acid (VITAMIN C) 1000 MG tablet Take 1,000 mg by mouth 2 (two) times daily.    [provider]  Coenzyme  Q10 (COQ10) 200 MG CAPS Take 200 mg by mouth daily.    [provider]  folic acid (FOLVITE) 892 MCG tablet Take 800 mcg by mouth daily.    [provider]  Garlic 119 MG CAPS Take 4 capsules by mouth 2 (two) times daily.    [provider]  Ginger, Zingiber officinalis, (GINGER ROOT) 550 MG CAPS Take 2 capsules by mouth daily.    [provider]  magnesium gluconate (MAGONATE) 500 MG tablet Take 500 mg by mouth 2 (two) times daily.    [provider]  Magnesium Oxide, Antacid, 500 MG CAPS Take 1 capsule by mouth 2 (two) times daily.    [provider]  Multiple Vitamin (MULTIVITAMIN) tablet Take 1 tablet by mouth daily.    [provider]  zinc gluconate 50 MG tablet Take 50 mg by mouth daily.    [provider]    Family History Family History  Problem Relation Age of Onset  . Stroke Sister   . Heart disease Mother   . Heart attack Mother 74       Deceased  . Hypertension Mother   . Dementia Mother   . Dementia Sister 69       Deceased    Social History Social History   Tobacco Use  . Smoking status: Former Smoker    Types: Cigars  . Smokeless tobacco: Former Systems developer  Substance Use Topics  . Alcohol use: Yes    Alcohol/week: 1.2 oz    Types: 2 Cans of beer per week  . Drug use: No     Allergies   Penicillins   Review of Systems Review of Systems  Constitutional: Negative for fever.  Respiratory: Negative for shortness of breath.   Cardiovascular: Negative.   Gastrointestinal: Negative.   Musculoskeletal: Negative for arthralgias, myalgias and neck pain.       L shoulder pain      Physical Exam Updated Vital Signs BP (!) 142/83   Pulse 64   Temp 98.2 F (36.8 C) (Oral)   Resp 18   SpO2 100%   General: Elderly male resting on ED stretcher comfortably, no acute distress Head: Normocephalic, atraumatic ENT: Moist mucus membranes CV: RRR Resp: Clear anterior breath sounds, normal work  of breathing, no distress  Abd: Soft, +BS, non-tender to palpation  Extr: Anterior displacement of proximal humeral head, no significant tenderness to palpation of area, ROM limited as result. Distal pulses intact   Neuro: Alert, masked facies and slowed movements, following commands and interacting appropriately  Skin: Warm, dry      ED Treatments / Results  Labs (all labs ordered are listed, but only abnormal results are displayed) Labs Reviewed - No data to display  EKG None  Radiology Dg Shoulder Left  Result Date: 04/18/2018 CLINICAL DATA:  Fall onto left shoulder. EXAM: LEFT SHOULDER - 2+ VIEW COMPARISON:  Shoulder radiograph 09/13/2017 FINDINGS: The glenohumeral joint is dislocated anteriorly. The contour of the lateral aspect of the humeral head has changed from the prior study, suggesting Hill-Sachs deformity. No other evidence of fracture. The acromioclavicular joint is approximated in the clavicle is intact. IMPRESSION: Anterior left shoulder dislocation with suspected [employer] lesion. Electronically Signed   By: Ulyses Jarred M.D.   On: 04/18/2018 13:39   Dg Shoulder Left Portable  Result Date: 04/18/2018 CLINICAL DATA:  Post reduction films. EXAM: LEFT SHOULDER - 1 VIEW COMPARISON:  None. FINDINGS: The previous anterior inferior shoulder dislocation has been reduced. Due to overlapping osseous shadows, difficult to evaluate for integrity of the humeral head or glenoid labrum. No definite fractures are seen however. IMPRESSION: Anterior inferior shoulder dislocation has reduced. Electronically Signed   By: Staci Righter M.D.   On: 04/18/2018 19:18    Procedures Reduction of dislocation Date/Time: 04/18/2018 7:26 PM Performed by: Tawny Asal, MD Authorized by: Carmin Muskrat, MD  Consent: Verbal consent obtained. Written consent obtained. Consent given by: spouse Imaging studies: imaging studies available Patient identity confirmed: arm band Time out: Immediately prior  to procedure a "time out" was called to verify the correct patient, procedure, equipment, support staff and site/side marked as required.  Sedation: Patient sedated: yes Sedatives: etomidate  Patient tolerance: Patient tolerated the procedure well with no immediate complications Comments: Successful shoulder dislocation reduction with procedural sedation using etomidate and lidocaine joint injection.     (including critical care time)  Medications Ordered in ED Medications  lidocaine (PF) (XYLOCAINE) 1 % injection 5 mL (5 mLs Other Given 04/18/18 1746)  etomidate (AMIDATE) injection 10 mg (10 mg Intravenous Given 04/18/18 1828)     Initial Impression / Assessment and Plan / ED Course  I have reviewed the triage vital signs and the nursing notes.  Pertinent labs & imaging results that were available during my care of the patient were reviewed by me and considered in my medical decision making (see chart for details).  84 yo M with a history of Parkinson's, dementia presenting with an anterior shoulder dislocation with likely Hill-Sachs lesion.  Patient underwent unsuccessful reduction at outpatient office. Duration of dislocation may decrease likelihood of successful reduction. Will administer intra-articular lidocaine injection and etomidate for procedural sedation and attempt repeat reduction at bedside.   Reduction with sedation successful, though proximal humerus feels somewhat unstable especially with shoulder extension. Splint applied to keep arm adducted with shoulder and elbow slightly flexed. Repeat XR shows confirms successful reduction of R humerus. Recommend follow up with orthopedics, referral placed.   Final Clinical Impressions(s) / ED Diagnoses   Final diagnoses:  Shoulder dislocation, left, initial encounter    ED Discharge Orders        Ordered    AMB referral to orthopedics    Comments:  Shoulder dislocation s/p reduction in ED   04/18/18 1926       Tawny Asal, MD 04/18/18 Kathyrn Drown    Carmin Muskrat, MD 04/18/18 2211

## 2018-04-18 NOTE — Assessment & Plan Note (Signed)
patient with anterior shoulder dislocation, visualized head of humerus on ultrasound.  Stimson and traction-countertraction methods performed to relocate but were unsuccessful.  He has had multiple falls the past several months and wife noted deformity noted at least 2-3 weeks ago on repeat questioning.  Transported to ED to likely relocate with sedation.  Tylenol if needed, icing after the procedure.  F/u ~1 week after relocation.

## 2018-04-18 NOTE — Progress Notes (Signed)
PCP: [facility]  Subjective:   HPI: Patient is a 84 y.o. male here for left shoulder pain.  Patient with PMH of parkinsons and dementia presents with several weeks of left shoulder appearing swollen and not using as much. Wife providing the history - states he has been complaining of pain in this shoulder more recently and using more of a shrug when he tries to lift the left arm. He denies pain to me today. No bruising. He has had several falls in the past - most recent 3-4 days ago and about 2 weeks ago. No skin changes, no complaints of numbness. No prior injuries or surgery to this. Has tried topical magnesium oil, gel, CMP max.  Past Medical History:  Diagnosis Date  . Chicken pox   . Parkinson's disease ([place])   . Prostate cancer ([place])   . Stroke [facility]) 2012    Current Outpatient Medications on File Prior to Visit  Medication Sig Dispense Refill  . ascorbic acid (VITAMIN C) 1000 MG tablet Take 1,000 mg by mouth 2 (two) times daily.    . Coenzyme Q10 (COQ10) 200 MG CAPS Take 200 mg by mouth daily.    . folic acid (FOLVITE) 053 MCG tablet Take 800 mcg by mouth daily.    . Garlic 976 MG CAPS Take 4 capsules by mouth 2 (two) times daily.    . Ginger, Zingiber officinalis, (GINGER ROOT) 550 MG CAPS Take 2 capsules by mouth daily.    . magnesium gluconate (MAGONATE) 500 MG tablet Take 500 mg by mouth 2 (two) times daily.    . Magnesium Oxide, Antacid, 500 MG CAPS Take 1 capsule by mouth 2 (two) times daily.    . Multiple Vitamin (MULTIVITAMIN) tablet Take 1 tablet by mouth daily.    Marland Kitchen zinc gluconate 50 MG tablet Take 50 mg by mouth daily.     No current facility-administered medications on file prior to visit.     Past Surgical History:  Procedure Laterality Date  . SINUS ENDO W/FUSION  2011   Infection & Stents placed  . WISDOM TOOTH EXTRACTION      Allergies  Allergen Reactions  . Penicillins Swelling    Social History   Socioeconomic History  .  Marital status: Married    Spouse name: Not on file  . Number of children: Not on file  . Years of education: Not on file  . Highest education level: Not on file  Occupational History  . Not on file  Social Needs  . Financial resource strain: Not on file  . Food insecurity:    Worry: Not on file    Inability: Not on file  . Transportation needs:    Medical: Not on file    Non-medical: Not on file  Tobacco Use  . Smoking status: Former Smoker    Types: Cigars  . Smokeless tobacco: Former Network engineer and Sexual Activity  . Alcohol use: Yes    Alcohol/week: 1.2 oz    Types: 2 Cans of beer per week  . Drug use: No  . Sexual activity: Not on file  Lifestyle  . Physical activity:    Days per week: Not on file    Minutes per session: Not on file  . Stress: Not on file  Relationships  . Social connections:    Talks on phone: Not on file    Gets together: Not on file    Attends religious service: Not on file    Active  member of club or organization: Not on file    Attends meetings of clubs or organizations: Not on file    Relationship status: Not on file  . Intimate partner violence:    Fear of current or ex partner: Not on file    Emotionally abused: Not on file    Physically abused: Not on file    Forced sexual activity: Not on file  Other Topics Concern  . Not on file  Social History Narrative  . Not on file    Family History  Problem Relation Age of Onset  . Stroke Sister   . Heart disease Mother   . Heart attack Mother 71       Deceased  . Hypertension Mother   . Dementia Mother   . Dementia Sister 46       Deceased    BP (!) 122/52   Pulse 68   Ht 5\' 7"  (1.702 m)   Wt 157 lb (71.2 kg)   BMI 24.59 kg/m   Review of Systems: See HPI above.     Objective:  Physical Exam:  Gen: NAD, comfortable in exam room  Left shoulder: Gross deformity anteriorly compared to right, loss of shoulder rounding.  No bruising, soft tissue swelling. Denies pain  on palpation throughout shoulder, upper arm. ROM limited to 45 degrees IR, 30 ER.  Abduction to 50 degrees, flexion to 50 degrees. Cannot position for empty can.  Strength 4/5 ER and IR. NVI distally.  Right shoulder: No deformity. ROM limited to 90 degrees abduction and flexion, 60 ER, full IR with 5/5 strength RTC muscles. No tenderness to palpation. NVI distally.   Assessment & Plan:  1. Left shoulder pain - patient with anterior shoulder dislocation, visualized head of humerus on ultrasound.  Stimson and traction-countertraction methods performed to relocate but were unsuccessful.  He has had multiple falls the past several months and wife noted deformity noted at least 2-3 weeks ago on repeat questioning.  Transported to ED to likely relocate with sedation.  Tylenol if needed, icing after the procedure.  F/u ~1 week after relocation.

## 2018-04-18 NOTE — ED Triage Notes (Signed)
Patient arrived from MD office for further evaluation of possible left shoulder dislocation a few weeks ago post fall. Patient alert and complains of ongoing pain

## 2018-04-18 NOTE — ED Provider Notes (Signed)
.  Sedation Date/Time: 04/18/2018 6:30 PM Performed by: Carmin Muskrat, MD Authorized by: Carmin Muskrat, MD   Consent:    Consent obtained:  Written   Consent given by:  Spouse   Risks discussed:  Inadequate sedation, nausea and respiratory compromise necessitating ventilatory assistance and intubation   Alternatives discussed:  Analgesia without sedation Universal protocol:    Procedure explained and questions answered to patient or proxy's satisfaction: yes     Relevant documents present and verified: yes     Test results available and properly labeled: yes     Imaging studies available: yes     Immediately prior to procedure a time out was called: yes     Patient identity confirmation method:  Arm band Indications:    Procedure performed:  Dislocation reduction   Procedure necessitating sedation performed by:  Physician performing sedation   Intended level of sedation:  Moderate (conscious sedation) Pre-sedation assessment:    Time since last food or drink:  3   ASA classification: class 3 - patient with severe systemic disease     Neck mobility: reduced     Mouth opening:  2 finger widths   Thyromental distance:  2 finger widths   Mallampati score:  III - soft palate, base of uvula visible   Pre-sedation assessments completed and reviewed: airway patency, cardiovascular function, hydration status, mental status, nausea/vomiting, pain level, respiratory function and temperature     Pre-sedation assessment completed:  04/18/2018 6:30 PM Immediate pre-procedure details:    Reassessment: Patient reassessed immediately prior to procedure     Reviewed: vital signs, relevant labs/tests and NPO status     Verified: bag valve mask available, emergency equipment available, intubation equipment available, IV patency confirmed and oxygen available   Procedure details (see MAR for exact dosages):    Preoxygenation:  Nasal cannula   Sedation:  Etomidate   Analgesia:  None   Intra-procedure  monitoring:  Blood pressure monitoring, cardiac monitor, continuous pulse oximetry, continuous capnometry, frequent LOC assessments and frequent vital sign checks   Intra-procedure events: none     Total Provider sedation time (minutes):  20 Post-procedure details:    Post-sedation assessment completed:  04/18/2018 7:00 PM   Attendance: Constant attendance by certified staff until patient recovered     Recovery: Patient returned to pre-procedure baseline     Post-sedation assessments completed and reviewed: airway patency, cardiovascular function, hydration status, mental status, nausea/vomiting, pain level, respiratory function and temperature     Patient is stable for discharge or admission: yes     Patient tolerance:  Tolerated well, no immediate complications      Carmin Muskrat, MD 04/18/18 2214

## 2018-04-18 NOTE — Discharge Instructions (Signed)
The X-rays after the procedure show the shoulder is back in place. Keep the shoulder in the sling for the next several days if possible to avoid another dislocation. We placed a referral to the orthopedics doctors to see him on follow up for further input on options to keep this from recurring.

## 2018-04-19 NOTE — Addendum Note (Signed)
Addended by: Dene Gentry on: 04/19/2018 02:37 PM   Modules accepted: Level of Service

## 2018-04-23 ENCOUNTER — Ambulatory Visit (INDEPENDENT_AMBULATORY_CARE_PROVIDER_SITE_OTHER): Payer: Medicare HMO | Admitting: Orthopaedic Surgery

## 2018-04-23 ENCOUNTER — Ambulatory Visit (INDEPENDENT_AMBULATORY_CARE_PROVIDER_SITE_OTHER): Payer: Medicare HMO

## 2018-04-23 ENCOUNTER — Encounter (INDEPENDENT_AMBULATORY_CARE_PROVIDER_SITE_OTHER): Payer: Self-pay | Admitting: Orthopaedic Surgery

## 2018-04-23 DIAGNOSIS — M25512 Pain in left shoulder: Secondary | ICD-10-CM

## 2018-04-23 NOTE — Progress Notes (Signed)
Office Visit Note   Patient: Roger Cohen           Date of Birth: 1928/01/01           MRN: [id number] Visit Date: 04/23/2018              Requested by: [place],  [id number] PCP: [place]: Visit Diagnoses:  1. Acute pain of left shoulder     Plan: Impression is left shoulder anterior subluxation question chronic in nature?  We will have the patient continue wearing the sling for another 3 weeks.  He will follow-up with [place] at that time or we will send him to outpatient physical therapy.  This was discussed with his wife who was in the room at the time.  Follow-Up Instructions: Return in about 3 weeks (around 05/14/2018).   Orders:  Orders Placed This Encounter  Procedures  . XR Shoulder Left   No orders of the defined types were placed in this encounter.     Procedures: No procedures performed   Clinical Data: No additional findings.   Subjective: Chief Complaint  Patient presents with  . Left Shoulder - Pain    HPI patient is a pleasant 84 year old gentleman who presents to our clinic today for follow-up of his left shoulder.  He lives at home with his wife when she noticed that his left shoulder had not been looking right for approximately 2 to 3 weeks.  She took him to the ED on 04/18/2018 where x-rays were obtained.  These showed an anterior subluxation.  This was reduced in the ED and he was placed in a sling.  The wife notes that he falls regularly but does not notice specific incidents when this may have occurred.  The patient does not appear to be complaining of much pain.  He does have multiple comorbidities to include dementia and Parkinson's.  Review of Systems as detailed in HPI.  All others reviewed and are negative.   Objective: Vital Signs: There were no vitals taken for this visit.  Physical Exam well-developed and well-nourished gentleman in no acute  distress.  Ortho Exam examination of the left shoulder reveals no noticeable deformity.  No bony tenderness.  Motor and sensory function intact distally.  Specialty Comments:  No specialty comments available.  Imaging: Xr Shoulder Left  Result Date: 04/23/2018 X-rays of the left shoulder show chronic pseudosubluxation of the humeral head likely from rotator cuff deficiency    PMFS History: Patient Active Problem List   Diagnosis Date Noted  . Left shoulder pain 04/18/2018  . HTN (hypertension) 01/09/2016  . Dementia without behavioral disturbance 01/09/2016  . Anemia, chronic disease 01/09/2016  . Fall 02/20/2014  . Parkinson's disease ([place]) 08/05/2013  . Prostate cancer ([place]) 08/05/2013  . Cubital tunnel syndrome on left 08/05/2013  . Visit for preventive health examination 08/05/2013  . Edema 08/02/2013  . Onychomycosis 08/02/2013  . Pain in joint, ankle and foot 08/02/2013   Past Medical History:  Diagnosis Date  . Chicken pox   . Parkinson's disease ([place])   . Prostate cancer ([place])   . Stroke [facility]) 2012    Family History  Problem Relation Age of Onset  . Stroke Sister   . Heart disease Mother   . Heart attack Mother 71       Deceased  . Hypertension Mother   . Dementia Mother   . Dementia Sister  82       Deceased    Past Surgical History:  Procedure Laterality Date  . SINUS ENDO W/FUSION  2011   Infection & Stents placed  . WISDOM TOOTH EXTRACTION     Social History   Occupational History  . Not on file  Tobacco Use  . Smoking status: Former Smoker    Types: Cigars  . Smokeless tobacco: Former Network engineer and Sexual Activity  . Alcohol use: Yes    Alcohol/week: 1.2 oz    Types: 2 Cans of beer per week  . Drug use: No  . Sexual activity: Not on file

## 2018-05-15 ENCOUNTER — Encounter (INDEPENDENT_AMBULATORY_CARE_PROVIDER_SITE_OTHER): Payer: Self-pay | Admitting: Orthopaedic Surgery

## 2018-05-15 ENCOUNTER — Ambulatory Visit (INDEPENDENT_AMBULATORY_CARE_PROVIDER_SITE_OTHER): Payer: Medicare HMO | Admitting: Orthopaedic Surgery

## 2018-05-15 DIAGNOSIS — M25512 Pain in left shoulder: Secondary | ICD-10-CM

## 2018-05-15 NOTE — Progress Notes (Signed)
Office Visit Note   Patient: Roger Cohen           Date of Birth: 1928-05-15           MRN: [id number] Visit Date: 05/15/2018              Requested by: [place], [place] [address] PCP: [place]: Visit Diagnoses:  1. Acute pain of left shoulder     Plan: Impression is chronic anterior left shoulder subluxation likely due to chronic rotator cuff insufficiency.  At patient's age and activity level with conservative physical therapy for joint mobilization function.  This was discussed with the wife.  Follow-up with  Follow-Up Instructions: Return if symptoms worsen or fail to improve.   Orders:  No orders of the defined types were placed in this encounter.  No orders of the defined types were placed in this encounter.     Procedures: No procedures performed   Clinical Data: No additional findings.   Subjective: Chief Complaint  Patient presents with  . Left Shoulder - Follow-up     Patient is a 84 year old gentleman comes in for follow-up of his left shoulder subluxation.  He has no pain at rest.  He has not been wearing a sling for about a week and a half.   Review of Systems  Constitutional: Negative.   All other systems reviewed and are negative.    Objective: Vital Signs: There were no vitals taken for this visit.  Physical Exam  Constitutional: He is oriented to person, place, and time. He appears well-developed and well-nourished.  Pulmonary/Chest: Effort normal.  Abdominal: Soft.  Neurological: He is alert and oriented to person, place, and time.  Skin: Skin is warm.  Psychiatric: He has a normal mood and affect. His behavior is normal. Judgment and thought content normal.  Nursing note and vitals reviewed.   Ortho Exam Left shoulder exam is stable. Specialty Comments:  No specialty comments available.  Imaging: No results found.   PMFS  History: Patient Active Problem List   Diagnosis Date Noted  . Left shoulder pain 04/18/2018  . HTN (hypertension) 01/09/2016  . Dementia without behavioral disturbance 01/09/2016  . Anemia, chronic disease 01/09/2016  . Fall 02/20/2014  . Parkinson's disease ([place]) 08/05/2013  . Prostate cancer ([place]) 08/05/2013  . Cubital tunnel syndrome on left 08/05/2013  . Visit for preventive health examination 08/05/2013  . Edema 08/02/2013  . Onychomycosis 08/02/2013  . Pain in joint, ankle and foot 08/02/2013   Past Medical History:  Diagnosis Date  . Chicken pox   . Parkinson's disease ([place])   . Prostate cancer ([place])   . Stroke [facility]) 2012    Family History  Problem Relation Age of Onset  . Stroke Sister   . Heart disease Mother   . Heart attack Mother 55       Deceased  . Hypertension Mother   . Dementia Mother   . Dementia Sister 47       Deceased    Past Surgical History:  Procedure Laterality Date  . SINUS ENDO W/FUSION  2011   Infection & Stents placed  . WISDOM TOOTH EXTRACTION     Social History   Occupational History  . Not on file  Tobacco Use  . Smoking status: Former Smoker    Types: Cigars  . Smokeless tobacco: Former Network engineer and Sexual Activity  . Alcohol use:  Yes    Alcohol/week: 1.2 oz    Types: 2 Cans of beer per week  . Drug use: No  . Sexual activity: Not on file

## 2018-05-18 ENCOUNTER — Telehealth (INDEPENDENT_AMBULATORY_CARE_PROVIDER_SITE_OTHER): Payer: Self-pay | Admitting: Orthopaedic Surgery

## 2018-05-18 NOTE — Telephone Encounter (Signed)
Received call from Sam with [facility] advised patient lost his Rx. Sam asked if the Rx can be faxed to her. The fax# is [phone]   The phone # is [phone]

## 2018-05-18 NOTE — Telephone Encounter (Signed)
FAXED ORDER TO SAM [phone] NUMBER

## 2018-07-02 NOTE — Progress Notes (Signed)
Roger Cohen was seen today in the movement disorders clinic for neurologic consultation..  The consultation is for the evaluation of "Parkinsons."  The records that have been made available to me are reviewed in detail.  The patient has been seen both at [facility] as well as locally in [facility] by Dr. Everette Rank.  The history is supplied by the wife as patient cannot tell his own hx.  The patient was first seen by Dr. Anna Genre at [facility] in March, 2016.  Patient has had symptoms since 2013.  First symptom was walking and balance issues.  By 2014, he developed frequent falls.  By the time he saw Dr. Anna Genre in 2016, he was falling 3-4 times per week.  Dr. Anna Genre felt that the patient did not have Parkinson's disease, given lack of response to levodopa (was on carbidopa/levodopa 25/100, 2 po tid).  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  MRI was obtained due to assess for this.  2016 MRI reports indicate minimal T2 hyperintensities.  There is also severe cervical spine stenosis at C2-C3.  His first recommendation was a weaning of the ropinirole because of lower extremity edema that the patient was experiencing.  Following that, he recommended weaning the levodopa.  A trial of baclofen was recommended.  It was also recommended that he see a spine surgeon given the spinal stenosis.  Patient did not tolerate the baclofen due to lethargy.  This was discontinued.  When seen at [facility] in 05/2016, he had Botox for eyelid opening apraxia (wife not sure that it helped).  It was again recommended that he see a spine surgeon for the cervical stenosis.  He followed back up in October, 2017 and was given a prescription for Zanaflex and referral to neurosurgery was provided.  This was his last visit at [facility].  He transitioned care to [facility] and was seen by Dr. Everette Rank starting in 03/2017.  He was last seen there in November, 2018.  He has not seen a neurologist since.  His wife did call the neurology office in  March, 2019 with hallucinations.  Low-dose Xanax was recommended, but his wife reported that she had a difficult time getting the patient to swallow pills and she was going to try hemp instead.  It is noted that the patient was seen in the emergency room in May, 2019 with complaints of the left shoulder not appearing normal for several weeks.  He was diagnosed with a dislocated shoulder from falls.     Specific Symptoms:  Tremor: No. Family hx of similar:  No. Voice: garbled speech/babbling speech per wife Sleep: sleeps ok  Vivid Dreams:  Yes.    Acting out dreams:  No. Wet Pillows: No. Postural symptoms:  Yes.  , no walking since March-April, 2019.  States that he was approved for [facility] but hasn't gotten that.  Falls?  Yes.  , when transferring with wife.  Wife/pt both fell recently during transfer. Loss of smell:  No. Urinary Incontinence:  Yes.   Difficulty Swallowing:  Yes.  , especially drinks.  Uses thick it in food.  He doesn't like it when it is put in the water. Trouble with ADL's:  Yes.  , wife does it all.  Pays for bath at the day center.  Goes to day center 5 days per week, from 8:30-5:30.  No home caregiving Memory changes:  Yes.   Hallucinations:  Yes.   but rarely N/V:  No. Lightheaded:  No.  Syncope: No. Diplopia:  No. Dyskinesia:  No.    PREVIOUS MEDICATIONS: Sinemet and Requip  ALLERGIES:   Allergies  Allergen Reactions  . Penicillins Swelling  . Ropinirole Rash    CURRENT MEDICATIONS:  Outpatient Encounter Medications as of 07/16/2018  Medication Sig  . ascorbic acid (VITAMIN C) 1000 MG tablet Take 1,000 mg by mouth 2 (two) times daily.  . Coenzyme Q10 (COQ10) 200 MG CAPS Take 200 mg by mouth daily. occasionally  . Multiple Vitamin (MULTIVITAMIN) tablet Take 1 tablet by mouth daily.  . vitamin B-12 (CYANOCOBALAMIN) 1000 MCG tablet Take 1,000 mcg by mouth daily.  . [DISCONTINUED] folic acid (FOLVITE) 409 MCG tablet Take 800 mcg by mouth daily.  .  [DISCONTINUED] Garlic 811 MG CAPS Take 4 capsules by mouth 2 (two) times daily.  . [DISCONTINUED] Ginger, Zingiber officinalis, (GINGER ROOT) 550 MG CAPS Take 2 capsules by mouth daily.  . [DISCONTINUED] magnesium gluconate (MAGONATE) 500 MG tablet Take 500 mg by mouth 2 (two) times daily.  . [DISCONTINUED] Magnesium Oxide, Antacid, 500 MG CAPS Take 1 capsule by mouth 2 (two) times daily.  . [DISCONTINUED] zinc gluconate 50 MG tablet Take 50 mg by mouth daily.   No facility-administered encounter medications on file as of 07/16/2018.     PAST MEDICAL HISTORY:   Past Medical History:  Diagnosis Date  . Chicken pox   . Parkinson's disease ([place])   . Prostate cancer ([place])   . Stroke [facility]) 2012    PAST SURGICAL HISTORY:   Past Surgical History:  Procedure Laterality Date  . SINUS ENDO W/FUSION  2011   Infection & Stents placed  . WISDOM TOOTH EXTRACTION      SOCIAL HISTORY:   Social History   Socioeconomic History  . Marital status: Married    Spouse name: Not on file  . Number of children: Not on file  . Years of education: Not on file  . Highest education level: Not on file  Occupational History  . Not on file  Social Needs  . Financial resource strain: Not on file  . Food insecurity:    Worry: Not on file    Inability: Not on file  . Transportation needs:    Medical: Not on file    Non-medical: Not on file  Tobacco Use  . Smoking status: Former Smoker    Types: Cigars  . Smokeless tobacco: Former Network engineer and Sexual Activity  . Alcohol use: Yes    Alcohol/week: 1.2 oz    Types: 2 Cans of beer per week  . Drug use: No  . Sexual activity: Not on file  Lifestyle  . Physical activity:    Days per week: Not on file    Minutes per session: Not on file  . Stress: Not on file  Relationships  . Social connections:    Talks on phone: Not on file    Gets together: Not on file    Attends religious service: Not on file    Active member of club or organization:  Not on file    Attends meetings of clubs or organizations: Not on file    Relationship status: Not on file  . Intimate partner violence:    Fear of current or ex partner: Not on file    Emotionally abused: Not on file    Physically abused: Not on file    Forced sexual activity: Not on file  Other Topics Concern  . Not on file  Social History  Narrative  . Not on file    FAMILY HISTORY:   Family Status  Relation Name Status  . Sister  (Not Specified)  . Mother  (Not Specified)  . Sister  (Not Specified)    ROS:  Review of Systems  Unable to perform ROS: Dementia    PHYSICAL EXAMINATION:    VITALS:   Vitals:   07/16/18 1014  BP: 110/68  Pulse: 60  SpO2: 98%    GEN:  The patient appears stated age and is in NAD. HEENT:  Normocephalic, atraumatic.  The mucous membranes are moist. The superficial temporal arteries are without ropiness or tenderness. CV:  RRR Lungs:  CTAB Neck/HEME:  There are no carotid bruits bilaterally.  Neurological examination:  Orientation: The patient is alert and does follow simple commands and answers very simple questions, but not most questions.  Unable to perform Moca. Cranial nerves: There is good facial symmetry.  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  Could not look at pupils or do funduscopic exam as the patient squeezes the eyes tightly shut.  He has significant eyelid opening apraxia.  There is impaired upgaze.  Tried several times to look at downgaze, but he would squeeze the eyes tightly shut and could not get them open.  He is able to move the eyes horizontally bilaterally.  Speech is dysarthric.  It is hypophonic.  It lacks spontaneity.  Soft palate rises symmetrically and there is no tongue deviation. Hearing appears to be intact to conversational tone. Sensation: Sensation is intact to light touch.  He does not participate with more sensitive aspects. Motor: Strength is 5/5 in the bilateral upper and lower extremities.   Shoulder shrug is equal  and symmetric.  There is no pronator drift. Deep tendon reflexes: Deep tendon reflexes are 2-/4 at the bilateral biceps, triceps, brachioradialis, 2+ at the bilateral patella.  There is no Hoffman's or Tromner's response.  Movement examination: Tone: There is a component of gegenhalten, which makes examination difficult.  There does appear to be increased tone, albeit mild, and all 4 extremities.  The lower extremities have more increased tone in the upper extremities.  Abnormal movements: None Coordination:  There is decremation with RAM's, with all forms of rapid alternating movements, but he is also very slow to perform the tasks. Gait and Station: Did not ambulate with the patient today due to wife's report that he has significant difficulty even transferring at this point.  He was in a wheelchair.      ASSESSMENT/PLAN:  1.  Advanced PSP  -Long discussion with the patient and his wife.  We talked about nature, etiology and pathophysiology. We talked about how the symptoms, course and prognosis differ from Parkinson's Disease.  We talked about the risks, particularly for falls and aspiration.   -I will refer the patient for home PT/OT/home health aide/social work.  The goal with PT is to help with transfers, as both he and his wife have fallen in the transfers.  -The patient will have a MBE   -End-of-life issues were addressed.  Discussed level of care orders.  He wishes to be DNR.  He was very clear about that.  He was also very clear about the fact that he would never want a feeding tube.  -Patient/wife was invited to the atypical support group.  -Patient/wife were given information on support services through curePSP  -Talked to patient and his wife about consideration for alternative living situations if wife cannot continue to care for him.  -Discussed  with patient and wife that I do not recommend the power wheelchair which they have apparently already gone through channels together and  are just waiting for it.  Given patient's memory change and significant eyelid opening apraxia, I do not think he is a candidate for this.  2.  Severe cervical spinal stenosis  -Pt had MRI brain at [facility] that caught cervical spine and demonstrated severe CCS at C2-3.  Has been advised previously to f/u with neurosx.  Talked today about whether or not to repeat but given memory change and progression of PSP, not sure that he would be surgical candidate.  He made it clear that he did not want to proceed with MRI or neurosurgical consultation.  3.  Eyelid opening apraxia  -He has had Botox in the past.  This was offered again.  He denied that.  4.  Urinary incontinence  -asks about referral to urologist but doesn't want [facility].  Cannot travel out of town.  Explained that there are no other urology offices out of town.  Wife really had a misunderstanding at that office, and I recommended that they follow back up with [facility].  5.  Follow-up with me will be in 6 to 8 months, sooner should new neurologic issues arise.  Greater than 50% of the 60-minute visit was spent in counseling.  This did not include the 40 min of record review which was detailed above, which was non face to face time.   Cc:  [place]

## 2018-07-03 ENCOUNTER — Telehealth: Payer: Self-pay | Admitting: Psychology

## 2018-07-03 NOTE — Telephone Encounter (Signed)
Patient's wife called to express concerns about her husband's transportation.  Apparently, the transportation company that is paid through [employer] is picking up the patient to early from his day program in [place].  He often is made to leave before he finishes his snack and spends approximately 2 hours on the bus before coming back home and is often incontinent.  I problem solved with the wife about contacting [place] the pair of these transportation services to make a formal complaint as the patient is missing out on services he can receive due to his transportation arriving early.  I suggested that she mentioned that this makes a rush for him to leave and does not give the opportunity for him to go to the restroom which would help with some of the incontinence issues on the way home.  In addition, I recommended that she talk with the social worker at the [facility] that he attends so that this Education officer, museum can also call [place] to firm up the times that the patient needs to be picked up and to also advocate a better system for the patient so he can have a positive experience at his adult day program.  I will meet Roger Cohen when she brings her husband in the clinic in a few weeks.

## 2018-07-16 ENCOUNTER — Other Ambulatory Visit (HOSPITAL_COMMUNITY): Payer: Self-pay | Admitting: Neurology

## 2018-07-16 ENCOUNTER — Encounter: Payer: Self-pay | Admitting: Neurology

## 2018-07-16 ENCOUNTER — Ambulatory Visit (INDEPENDENT_AMBULATORY_CARE_PROVIDER_SITE_OTHER): Payer: Medicare HMO | Admitting: Neurology

## 2018-07-16 ENCOUNTER — Encounter: Payer: Self-pay | Admitting: Psychology

## 2018-07-16 VITALS — BP 110/68 | HR 60

## 2018-07-16 DIAGNOSIS — G231 Progressive supranuclear ophthalmoplegia [Steele-Richardson-Olszewski]: Secondary | ICD-10-CM | POA: Diagnosis not present

## 2018-07-16 DIAGNOSIS — R32 Unspecified urinary incontinence: Secondary | ICD-10-CM | POA: Diagnosis not present

## 2018-07-16 DIAGNOSIS — R482 Apraxia: Secondary | ICD-10-CM | POA: Diagnosis not present

## 2018-07-16 DIAGNOSIS — R1319 Other dysphagia: Secondary | ICD-10-CM

## 2018-07-16 DIAGNOSIS — R1310 Dysphagia, unspecified: Secondary | ICD-10-CM

## 2018-07-16 NOTE — Addendum Note (Signed)
Addended byAnnamaria Helling on: 07/16/2018 12:02 PM   Modules accepted: Orders

## 2018-07-16 NOTE — Progress Notes (Signed)
I met the patient and his wife today while they are in the clinic.  We talked a little bit about resources that the patient may benefit from such as the PACE program ( wife did not seem interested).  I also shared that if they ever sought long-term care I would be more than happy to help provide resources for that.  Currently, at this time the patient attends an adult day program through [facility].  This is providing a level of respite care for the caregiver during the weekdays.  It appears that when the patient is not in respite care that he is hard to take care of as he is bigger in size and can be hard to transfer without risk of injury to the caregiver and/or patient.    Patient information on the care PSP foundation and suggested that the caregiver reach out to them to get connected on information and services that they provide.  She may benefit from some respite care services although I did specify that they are short-term and time-limited.  In addition, the caregiver could benefit from the atypical parkinsonian support group that meets monthly in [place] and I provided this information to her.  She gave me permission to add her email to the list.  I provided her with a caregiver packet on cure PSP.  She wanted information on a geriatric primary care provider and I provided her with the name of [facility], Dr. Stoneking at [facility] and Julie Barr, NP at [facility].   I provided my contact information and encouraged the caregiver to call me with any questions. 

## 2018-07-16 NOTE — Patient Instructions (Signed)
1. We have scheduled you at [facility] for your modified barium swallow on 07/30/18 at 11:00 am. Please arrive 15 minutes prior and go to 1st floor radiology. If you need to reschedule for any reason please call [phone].

## 2018-07-17 ENCOUNTER — Telehealth: Payer: Self-pay | Admitting: Neurology

## 2018-07-17 NOTE — Telephone Encounter (Signed)
Telephone call with patient's caregiver today.  She is concerned because she was told by [facility]  that the patient needed to transfer independently or he would no longer be able to come to the facility for respite care services.  I looked in her chart to see that the patient was ordered home health services which included physical therapy and social work to start any day now.   The patient's caregiver appeared to think that this would be outpatient physical therapy that he would be brought to before his respite care program.  I clarified that this was in-home home health physical therapy to help her with transferring and it was more of teaching to help ensure safety for both her and her husband and will need to be done in the home.  I shared that this may be what he needs to help him maintain services [facility] during the day.  I brought up the PACE program as this program would provide adult day care and would provide physical therapy and other therapies at the facility during the day and this may be an option for the patient if she needed a higher level of care for him and did not want him to go into long-term care.  She reports a bad experience with this service in the past and did not elaborate further.  In addition, I suggested that there could be a possibility that the patient could go to a short-term long-term care facility/rehab unit to get the care that he needed as well.  I shared that this resource would be a little bit more complicated to access with insurance.  The caregiver said that she had to "just do this" even if " I don't agree with it."  I welcomed her to think about the options, but did remind her that home health physical therapy and those other services should start any day.  If she has any more questions she is more than welcome to call me.  If I can think of any more resources I will be more than happy to provide them for her and the patient.

## 2018-07-17 NOTE — Telephone Encounter (Signed)
Patient's wife called regarding the nurse calling from the [facility]. She is needing an order for PT. He has a Transport planner working with his Shoulder or would he need a PT for Parkinson's.  Please Call. Thanks

## 2018-07-17 NOTE — Telephone Encounter (Signed)
Called patient's wife. He states he goes to [facility] [facility] program from 8 am - 4:30 pm daily Monday - Friday. She is concerned about home health because he isn't home. She is also concerned because if he can not transfer then he will get kicked out of the day program. I spoke with Janett Billow, our Education officer, museum, about the situation for guidance and she is reaching out to patient's wife.

## 2018-07-19 ENCOUNTER — Emergency Department (HOSPITAL_COMMUNITY)
Admission: EM | Admit: 2018-07-19 | Discharge: 2018-07-21 | Disposition: A | Discharge status: Skilled Nursing Facility | Payer: Medicare HMO | Attending: Emergency Medicine | Admitting: Emergency Medicine

## 2018-07-19 ENCOUNTER — Telehealth: Payer: Self-pay | Admitting: Neurology

## 2018-07-19 ENCOUNTER — Encounter (HOSPITAL_COMMUNITY): Payer: Self-pay | Admitting: *Deleted

## 2018-07-19 DIAGNOSIS — I10 Essential (primary) hypertension: Secondary | ICD-10-CM | POA: Diagnosis not present

## 2018-07-19 DIAGNOSIS — T7601XA Adult neglect or abandonment, suspected, initial encounter: Secondary | ICD-10-CM | POA: Diagnosis present

## 2018-07-19 DIAGNOSIS — Z711 Person with feared health complaint in whom no diagnosis is made: Secondary | ICD-10-CM | POA: Diagnosis not present

## 2018-07-19 DIAGNOSIS — G20 Parkinson's disease: Secondary | ICD-10-CM | POA: Insufficient documentation

## 2018-07-19 DIAGNOSIS — Z79899 Other long term (current) drug therapy: Secondary | ICD-10-CM | POA: Diagnosis not present

## 2018-07-19 DIAGNOSIS — Z8546 Personal history of malignant neoplasm of prostate: Secondary | ICD-10-CM | POA: Insufficient documentation

## 2018-07-19 DIAGNOSIS — Z87891 Personal history of nicotine dependence: Secondary | ICD-10-CM | POA: Insufficient documentation

## 2018-07-19 DIAGNOSIS — F0390 Unspecified dementia without behavioral disturbance: Secondary | ICD-10-CM | POA: Insufficient documentation

## 2018-07-19 DIAGNOSIS — R69 Illness, unspecified: Secondary | ICD-10-CM

## 2018-07-19 MED ORDER — VITAMIN B-12 1000 MCG PO TABS
1000.0000 ug | ORAL_TABLET | Freq: Every day | ORAL | Status: DC
  Administered 2018-07-20 – 2018-07-21 (×2): 1000 ug via ORAL
  Filled 2018-07-19 (×3): qty 1

## 2018-07-19 MED ORDER — ADULT MULTIVITAMIN W/MINERALS CH
1.0000 | ORAL_TABLET | Freq: Every day | ORAL | Status: DC
Start: 2018-07-19 — End: 2018-07-21
  Administered 2018-07-20 – 2018-07-21 (×2): 1 via ORAL
  Filled 2018-07-19 (×2): qty 1

## 2018-07-19 NOTE — ED Notes (Signed)
Patient sitting up in bed with no signs of distress noted. Pt cooperative at this time.
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
## 2018-07-19 NOTE — ED Notes (Signed)
Bed: WA27 Expected date:  Expected time:  Means of arrival:  Comments: 

## 2018-07-19 NOTE — Telephone Encounter (Signed)
Roger Cohen, you had spoken with the wife about possible short term placement a couple days ago. Can you reach out to her to discuss?

## 2018-07-19 NOTE — Progress Notes (Addendum)
Per APS worker Ardeen Garland at ph: [phone],  the pt attends an adult daycare program and APS has already spoken to a couple of facilities about providing respite care.  Per the APS worker, [facility] on [address] [facility] is considering accepting the pt but the cost is $115 for only basic care but does not include additional needs to be met.  CSW updated Miss Polito with DSS who already has an open APS case with the pt and is working on finding placement at this time.  CSW emphasized pt would not be able to remain in the ED for placement beyond tomorrow.  DSS worker Solicitor voiced understanding.  Per Miss Polito, she is sending referrals out to facilities currently.  CSW also provided Miss Polito with the Director of [facility] and another facility (Debbie at ph: [phone] or [phone] ) and informed Miss Polito about the facility and that it has comprehensive respite care at a lower cost than others that includes bathing, laundry, PT/OT if required and meals, etc.  Please reconsult if future social work needs arise.  CSW signing off, as social work intervention is no longer needed.  Alphonse Guild. Hall [place], LCSW, LCAS, CSI Clinical Social Worker Ph: [phone]
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
## 2018-07-19 NOTE — Telephone Encounter (Signed)
Patient wife needs to talk to someone about getting her husband placed somewhere while she has surgery and recovers please call

## 2018-07-19 NOTE — ED Notes (Signed)
Dr. Tomi Bamberger aware of BP; no new orders received.
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
## 2018-07-20 NOTE — Progress Notes (Signed)
CSW received handoff from 2nd shift ED CSW. CSW spoke with Ardeen Garland regarding respite placement for patient. Per Angie, she was not too familiar with patient prior to last night. Angie stated she reached out to some facilities but that they were requesting an FL2 and other information Angie did not know. CSW was advised to reach out to [place] ph: [phone] and/or Thayer Jew ph: [phone] for additional information. CSW attempted to contact Lorriane Shire, left voicemail. CSW spoke with Thayer Jew who states there are no new updates on patient. CSW informed Thayer Jew that patient could not stay here as he was cleared and ready for discharge. Per Thayer Jew, they are working on finding a respite bed for him. CSW requested that Thayer Jew keep her updated. CSW will continue to follow.  Ollen Barges, [place] Work Department  [employer]  [phone]

## 2018-07-20 NOTE — Telephone Encounter (Signed)
[name] sent to [name], [place],   Patient, Roger Cohen - we tried to do a [facility] on 07/18/18. The patient/spouse refused due to having a family member in the ICU. The patient/spouse asked [place] to call back next week. The patient is now in the ED. The RN CM/SW are looking for placement options. Just wanted you to know where we were with your patient. We were never able to open him.  Thanks,  Hoyle Sauer   Previous Messages    ----- Message -----  From: [name], CMA  Sent: 07/16/2018 12:02 PM EDT  To: [name]  Subject: Referral                     We will try again. :)  Referral for PT/OT/ST/social work  DOB [birth year]   Thanks,   Luvenia Starch

## 2018-07-20 NOTE — ED Notes (Signed)
Acuity increased due to need for safety sitter and social work consult
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
## 2018-07-20 NOTE — Care Management Note (Addendum)
Case Management Note  Patient Details  Name: Roger Cohen MRN: [id number] Date of Birth: 1928/03/03  CM consulted by CSW for assistance in finding [facility] agency information.  CM contacted [facility] per notes and spoke with Luvenia Starch who confirmed the pt was referred to [facility] and was accepted for [facility] PT OT SLP SW services.  CM spoke with Hoyle Sauer with [facility] to find out start of care date and any other information pertinent to pt.  She reports they received the referral on 8/5 but she did not see a start of care date.  CM updated her on pt's current situation.  She reports that the pt or spouse refused the start of care visit on 8/7 due to a family member being in the ICU and requested they start care next week.  The pt is not currently active with them but [place] orders are still in place for once they can have a start/initial visit next week.  Updated CSW.  CM will follow for further needs.  Nelia Rogoff, Benjaman Lobe, RN 07/20/2018, 1:41 PM

## 2018-07-20 NOTE — ED Notes (Signed)
Placed condom cath to help patient with his incontinence

## 2018-07-21 ENCOUNTER — Other Ambulatory Visit: Payer: Self-pay

## 2018-07-21 NOTE — ED Notes (Signed)
[facility] called for transport.  

## 2018-07-21 NOTE — ED Provider Notes (Signed)
Pt seen and examined today Afebrile Vitals stable Appears at baseline Continue current meds Stable for discharge   Lacretia Leigh, MD 07/21/18 1404

## 2018-07-21 NOTE — NC FL2 (Signed)
[place] LEVEL OF CARE SCREENING TOOL     IDENTIFICATION  Patient Name: Roger Cohen Birthdate: 09/29/1928 Sex: male Admission Date (Current Location): 07/19/2018  [facility] and [place] Number:  Herbalist and Address:  [facility],  [place] [address], [place]      Provider Number: [id number]  Attending Physician Name and Address:  Default, Provider, MD  Relative Name and Phone Number:  Lanae Boast, [phone]     Current Level of Care:  Home and adult day care Recommended Level of Care: [place], Other (Comment)(Respite Care) Prior Approval Number:    Date Approved/Denied:   PASRR Number: [id number]  Discharge Plan: Other (Comment)([facility] for respite)    Current Diagnoses: Patient Active Problem List   Diagnosis Date Noted  . PSP (progressive supranuclear palsy) ([place]) 07/16/2018  . Left shoulder pain 04/18/2018  . HTN (hypertension) 01/09/2016  . Dementia without behavioral disturbance 01/09/2016  . Anemia, chronic disease 01/09/2016  . Fall 02/20/2014  . Prostate cancer ([place]) 08/05/2013  . Cubital tunnel syndrome on left 08/05/2013  . Visit for preventive health examination 08/05/2013  . Edema 08/02/2013  . Onychomycosis 08/02/2013  . Pain in joint, ankle and foot 08/02/2013    Orientation RESPIRATION BLADDER Height & Weight     Self, Place, Time(Not oriented to situation)  Normal Incontinent Weight:   Height:     BEHAVIORAL SYMPTOMS/MOOD NEUROLOGICAL BOWEL NUTRITION STATUS       Incontinent Regular Diet  AMBULATORY STATUS COMMUNICATION OF NEEDS Skin   Maximum Assistance Verbally Normal                       Personal Care Assistance Level of Assistance  Bathing, Feeding, Dressing Bathing Assistance: Maximum assistance Feeding assistance: Limited assistance Dressing Assistance: Maximum assistance     Functional Limitations Info             SPECIAL  CARE FACTORS FREQUENCY                       Contractures Contractures Info: Not present    Additional Factors Info  Code Status, Allergies Code Status Info: Not on File Allergies Info: Penicillins           Current Medications (07/21/2018):  This is the current hospital active medication list Current Facility-Administered Medications  Medication Dose Route Frequency Provider Last Rate Last Dose  . multivitamin with minerals tablet 1 tablet  1 tablet Oral Daily Dorie Rank, MD   Stopped at 07/20/18 [phone]  . vitamin B-12 (CYANOCOBALAMIN) tablet 1,000 mcg  1,000 mcg Oral Daily Dorie Rank, MD   Stopped at 07/20/18 [phone]   Current Outpatient Medications  Medication Sig Dispense Refill  . acetaminophen (TYLENOL) 325 MG tablet Take 650 mg by mouth every 6 (six) hours as needed for moderate pain.    Marland Kitchen ascorbic acid (VITAMIN C) 1000 MG tablet Take 1,000 mg by mouth 2 (two) times daily.    . Coenzyme Q10 (COQ10) 200 MG CAPS Take 200 mg by mouth daily. occasionally    . GARLIC PO Take 1 tablet by mouth 2 (two) times daily.    . Multiple Vitamin (MULTIVITAMIN) tablet Take 1 tablet by mouth daily.    . Multiple Vitamins-Minerals (ZINC PO) Take 1 tablet by mouth daily.    . vitamin B-12 (CYANOCOBALAMIN) 1000 MCG tablet Take 1,000 mcg by mouth daily.  Discharge Medications: Please see discharge summary for a list of discharge medications.  Relevant Imaging Results:  Relevant Lab Results:   Additional Information SSN: [id number]  Joellen Jersey, [place]

## 2018-07-21 NOTE — NC FL2 (Addendum)
  [place] LEVEL OF CARE SCREENING TOOL     IDENTIFICATION  Patient Name: Roger Cohen Birthdate: 01-07-1928 Sex: male Admission Date (Current Location): 07/19/2018  [facility] and [place] Number:  Herbalist and Address:  [facility],  [place] [address] [place]      Provider Number: [id number]  Attending Physician Name and Address:  Default, Provider, MD  Relative Name and Phone Number:  Lanae Boast, [phone]     Current Level of Care:   Recommended Level of Care: [place], Other (Comment)(Respite Care) Prior Approval Number:    Date Approved/Denied:   PASRR Number: [id number]  Discharge Plan: Other (Comment)([facility] for respite)    Current Diagnoses: Patient Active Problem List   Diagnosis Date Noted  . PSP (progressive supranuclear palsy) ([place]) 07/16/2018  . Left shoulder pain 04/18/2018  . HTN (hypertension) 01/09/2016  . Dementia without behavioral disturbance 01/09/2016  . Anemia, chronic disease 01/09/2016  . Fall 02/20/2014  . Prostate cancer ([place]) 08/05/2013  . Cubital tunnel syndrome on left 08/05/2013  . Visit for preventive health examination 08/05/2013  . Edema 08/02/2013  . Onychomycosis 08/02/2013  . Pain in joint, ankle and foot 08/02/2013    Orientation RESPIRATION BLADDER Height & Weight     Self, Place, Time(Not oriented to situation)  Normal Incontinent Weight: 160 lb (72.6 kg) Height:  5\' 7"  (170.2 cm)  BEHAVIORAL SYMPTOMS/MOOD NEUROLOGICAL BOWEL NUTRITION STATUS        Diet(Regular Diet)  AMBULATORY STATUS COMMUNICATION OF NEEDS Skin   Limited Assist Verbally Normal                       Personal Care Assistance Level of Assistance  Bathing, Feeding, Dressing Bathing Assistance: Maximum assistance Feeding assistance: Limited assistance Dressing Assistance: Maximum assistance     Functional Limitations Info             SPECIAL CARE  FACTORS FREQUENCY                       Contractures Contractures Info: Not present    Additional Factors Info  Code Status, Allergies Code Status Info: Not on File Allergies Info: Penicillins            Discharge Medications: Please see discharge summary for a list of discharge medications. Medication Sig Dispense Refill  . acetaminophen (TYLENOL) 325 MG tablet Take 650 mg by mouth every 6 (six) hours as needed for moderate pain.    Marland Kitchen ascorbic acid (VITAMIN C) 1000 MG tablet Take 1,000 mg by mouth 2 (two) times daily.    . Coenzyme Q10 (COQ10) 200 MG CAPS Take 200 mg by mouth daily. occasionally    . GARLIC PO Take 1 tablet by mouth 2 (two) times daily.    . Multiple Vitamin (MULTIVITAMIN) tablet Take 1 tablet by mouth daily.    . Multiple Vitamins-Minerals (ZINC PO) Take 1 tablet by mouth daily.    . vitamin B-12 (CYANOCOBALAMIN) 1000 MCG tablet Take 1,000 mcg by mouth daily.      Relevant Imaging Results:  Relevant Lab Results:   Additional Information SSN: [id number]  Darden Dates, LCSW

## 2018-07-21 NOTE — Discharge Instructions (Addendum)
Continue all current medications - Vitamine B12 1066mcg qd - MVI qd -CoQ10 200mg  qd Tylenol 325mg  q6 prn pain

## 2018-07-21 NOTE — Progress Notes (Signed)
CSW called Sharyn Lull of [place] at [phone] to confirm patient's acceptance and arrival in the afternoon, instead of the earlier determined time of 11am due to the patient's wife needing to be present to complete paperwork at the accepting facility.  Sharyn Lull stated patient will need a new FL2. CSW to complete prior to patient's discharge.  CSW continuing to follow in an effort to coordinate discharge planning.  Stephanie Acre, [place] Social Worker [phone]

## 2018-07-21 NOTE — Progress Notes (Signed)
CSW spoke with Sharyn Lull of [place] at [phone] to confirm patient's acceptance and receipt of necessary documentation. The facility will need a completed LOG for the patient on Monday.  Sharyn Lull confirms that the patient's spouse is at [employer] out intake paperwork.   TCU nurse made aware of patient's readiness for transport.   CSW faxed after visit summary to [place].   Signing off. Please reconsult if additional CSW needs arise.  Stephanie Acre, [place] Social Worker [phone]

## 2018-07-24 ENCOUNTER — Encounter: Payer: Self-pay | Admitting: Adult Health

## 2018-07-24 ENCOUNTER — Non-Acute Institutional Stay (SKILLED_NURSING_FACILITY): Payer: Medicare HMO | Admitting: Adult Health

## 2018-07-24 DIAGNOSIS — F0390 Unspecified dementia without behavioral disturbance: Secondary | ICD-10-CM | POA: Diagnosis not present

## 2018-07-24 DIAGNOSIS — D638 Anemia in other chronic diseases classified elsewhere: Secondary | ICD-10-CM

## 2018-07-24 DIAGNOSIS — G231 Progressive supranuclear ophthalmoplegia [Steele-Richardson-Olszewski]: Secondary | ICD-10-CM

## 2018-07-24 DIAGNOSIS — I10 Essential (primary) hypertension: Secondary | ICD-10-CM

## 2018-07-24 DIAGNOSIS — C61 Malignant neoplasm of prostate: Secondary | ICD-10-CM
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
## 2018-07-25 ENCOUNTER — Encounter: Payer: Self-pay | Admitting: Adult Health

## 2018-07-25 ENCOUNTER — Non-Acute Institutional Stay (SKILLED_NURSING_FACILITY): Payer: Medicare HMO | Admitting: Adult Health

## 2018-07-25 DIAGNOSIS — F0390 Unspecified dementia without behavioral disturbance: Secondary | ICD-10-CM | POA: Diagnosis not present

## 2018-07-25 DIAGNOSIS — G231 Progressive supranuclear ophthalmoplegia [Steele-Richardson-Olszewski]: Secondary | ICD-10-CM

## 2018-07-25 DIAGNOSIS — C61 Malignant neoplasm of prostate: Secondary | ICD-10-CM

## 2018-07-25 LAB — BASIC METABOLIC PANEL
BUN: 21 (ref 4–21)
Creatinine: 0.9 (ref 0.6–1.3)
Glucose: 92
Potassium: 4.3 (ref 3.4–5.3)
Sodium: 143 (ref 137–147)

## 2018-07-25 LAB — CBC AND DIFFERENTIAL
HCT: 39 — AB (ref 41–53)
HEMOGLOBIN: 12.9 — AB (ref 13.5–17.5)
NEUTROS ABS: 3
PLATELETS: 203 (ref 150–399)
WBC: 4.8

## 2018-07-25 LAB — HEPATIC FUNCTION PANEL
ALK PHOS: 78 (ref 25–125)
ALT: 11 (ref 10–40)
AST: 14 (ref 14–40)
BILIRUBIN, TOTAL: 0.5

## 2018-07-25 NOTE — Progress Notes (Signed)
Location:   [facility] Room Number: 216 A Place of Service:  SNF (31)   CODE STATUS: Full Code  Allergies  Allergen Reactions  . Penicillins Swelling  . Ropinirole Rash    Chief Complaint  Patient presents with  . Acute Visit    Care Plan Meeting    HPI:  We have come together for a routine care plan meeting. He is unable to participate in the meeting, his wife is participating via phone. This more than likely represents a long term placement for him. We have discussed his advanced directives and have filled out a MOST form. She does participate with alternative therapies; but does understand that this is not possible in this environment. There are no reports of uncontrolled pain; no changes in appetite; no reports of anxiety present.    Past Medical History:  Diagnosis Date  . Chicken pox   . Parkinson's disease ([place])   . Prostate cancer ([place])   . Stroke [facility]) 2012    Past Surgical History:  Procedure Laterality Date  . SINUS ENDO W/FUSION  2011   Infection & Stents placed  . WISDOM TOOTH EXTRACTION      Social History   Socioeconomic History  . Marital status: Married    Spouse name: Not on file  . Number of children: Not on file  . Years of education: Not on file  . Highest education level: Not on file  Occupational History  . Not on file  Social Needs  . Financial resource strain: Not on file  . Food insecurity:    Worry: Not on file    Inability: Not on file  . Transportation needs:    Medical: Not on file    Non-medical: Not on file  Tobacco Use  . Smoking status: Former Smoker    Types: Cigars  . Smokeless tobacco: Former Network engineer and Sexual Activity  . Alcohol use: Yes    Alcohol/week: 2.0 standard drinks    Types: 2 Cans of beer per week  . Drug use: No  . Sexual activity: Not on file  Lifestyle  . Physical activity:    Days per week: Not on file    Minutes per session: Not on file  . Stress: Not on file    Relationships  . Social connections:    Talks on phone: Not on file    Gets together: Not on file    Attends religious service: Not on file    Active member of club or organization: Not on file    Attends meetings of clubs or organizations: Not on file    Relationship status: Not on file  . Intimate partner violence:    Fear of current or ex partner: Not on file    Emotionally abused: Not on file    Physically abused: Not on file    Forced sexual activity: Not on file  Other Topics Concern  . Not on file  Social History Narrative  . Not on file   Family History  Problem Relation Age of Onset  . Stroke Sister   . Heart disease Mother   . Heart attack Mother 105       Deceased  . Hypertension Mother   . Dementia Mother   . Dementia Sister 98       Deceased      VITAL SIGNS BP (!) 154/87   Pulse 65   Temp (!) 97.1 F (36.2 C)   Resp 18  Ht 5\' 7"  (1.702 m)   Wt 147 lb (66.7 kg)   SpO2 92%   BMI 23.02 kg/m   Outpatient Encounter Medications as of 07/25/2018  Medication Sig  . Multiple Vitamin (MULTIVITAMIN) tablet Take 1 tablet by mouth daily.  . Nutritional Supplements (NUTRITIONAL SUPPLEMENT PO) Regular Diet -  Mechanical soft texture, Nectar thickened fluids consistency  . vitamin B-12 (CYANOCOBALAMIN) 1000 MCG tablet Take 1,000 mcg by mouth daily.   No facility-administered encounter medications on file as of 07/25/2018.      SIGNIFICANT DIAGNOSTIC EXAMS   NO RECENT BLOOD WORK. RESULTS PENDING   Review of Systems  Unable to perform ROS: Other (unable to participate )   Physical Exam  Constitutional: No distress.  Frail   Neck: No thyromegaly present.  Cardiovascular: Normal rate, regular rhythm, normal heart sounds and intact distal pulses.  Both feet cold to touch   Pulmonary/Chest: Effort normal and breath sounds normal. No respiratory distress.  Abdominal: Soft. Bowel sounds are normal. He exhibits no distension. There is no tenderness.   Musculoskeletal: He exhibits no edema.  Is able to move all extremities; movements are slow and jerky   Lymphadenopathy:    He has no cervical adenopathy.  Neurological: He is alert.  Skin: Skin is warm and dry. He is not diaphoretic.  Psychiatric: He has a normal mood and affect.    ASSESSMENT/ PLAN:  TODAY:   1. PSP 2. Dementia 3. Prostate cancer  Will continue current medications Will not make changes in plan of care Goals of care is for long term placement MOST form has been filled out  Time spent with family: 45 minutes: discussed medical status; medications; goals of care. 20 minutes spent with advanced directives and MOST form filled out. Verbalized understanding.    MD is aware of resident's narcotic use and is in agreement with current plan of care. We will attempt to wean resident as apropriate   Ok Edwards NP [facility]  Contact [phone] Monday through Friday 8am- 5pm  After hours call [phone]

## 2018-07-26 ENCOUNTER — Non-Acute Institutional Stay (SKILLED_NURSING_FACILITY): Payer: Medicare HMO | Admitting: Internal Medicine

## 2018-07-26 ENCOUNTER — Encounter: Payer: Self-pay | Admitting: Internal Medicine

## 2018-07-26 DIAGNOSIS — F0390 Unspecified dementia without behavioral disturbance: Secondary | ICD-10-CM

## 2018-07-26 DIAGNOSIS — I10 Essential (primary) hypertension: Secondary | ICD-10-CM

## 2018-07-26 DIAGNOSIS — R296 Repeated falls: Secondary | ICD-10-CM

## 2018-07-26 DIAGNOSIS — C61 Malignant neoplasm of prostate: Secondary | ICD-10-CM | POA: Diagnosis not present

## 2018-07-26 DIAGNOSIS — G231 Progressive supranuclear ophthalmoplegia [Steele-Richardson-Olszewski]: Secondary | ICD-10-CM | POA: Diagnosis not present

## 2018-07-26 DIAGNOSIS — D638 Anemia in other chronic diseases classified elsewhere: Secondary | ICD-10-CM

## 2018-07-26 NOTE — Progress Notes (Signed)
Patient ID: Roger Cohen, male   DOB: [birth year], 84 y.o.   MRN: [id number]   Provider:  DR Arletha Grippe Location:  [place] Room Number: 216 A Place of Service:  SNF (31)  PCP: [place] Patient Care Team: Ashley as PCP - General  Extended Emergency Contact Information Primary Emergency Contact: Garlington,Myrtle Address: [address]          [place], [place] [address] [place] Phone: [phone] Mobile Phone: [phone] Relation: Spouse  Code Status:  DNR Goals of Care: Advanced Directive information Advanced Directives 07/26/2018  Does Patient Have a Medical Advance Directive? No  Would patient like information on creating a medical advance directive? No - Patient declined      Chief Complaint  Patient presents with  . New Admit To SNF    Admission    HPI: Patient is a 84 y.o. male seen today for admission to SNF following ED visit 07/19/18 for placement. Pt's spouse was unable to retrive him from adult daycare as she was involved in a MVA and fx arm. He has no concerns today. No nursing issues. He is a poor historian due to dementia. Hx obtained from chart. Care everywhere notes Hgb 12.9 (July 2019); LDL 79 (2018); PSA 28.06 (06/18/18)  Hx prostate CA - followed by urology; he is getting active surveillance per Care everywhere. PSA 28.06 in July 2019  Progressive supranuclear palsy - he is taking supplements. No recent fall but does have hx recurrent falls  HTN - he does not take any meds  Dementia - vascular +/-  PSP; albumin 4.0  Hx CVA - stable.    Past Medical History:  Diagnosis Date  . Chicken pox   . Parkinson's disease ([place])   . Prostate cancer ([place])   . Stroke [facility]) 2012   Past Surgical History:  Procedure Laterality Date  . SINUS ENDO W/FUSION  2011   Infection & Stents placed  . WISDOM TOOTH EXTRACTION      reports that he has quit smoking. His smoking use included  cigars. He has quit using smokeless tobacco. He reports that he drinks about 2.0 standard drinks of alcohol per week. He reports that he does not use drugs. Social History   Socioeconomic History  . Marital status: Married    Spouse name: Not on file  . Number of children: Not on file  . Years of education: Not on file  . Highest education level: Not on file  Occupational History  . Not on file  Social Needs  . Financial resource strain: Not on file  . Food insecurity:    Worry: Not on file    Inability: Not on file  . Transportation needs:    Medical: Not on file    Non-medical: Not on file  Tobacco Use  . Smoking status: Former Smoker    Types: Cigars  . Smokeless tobacco: Former Network engineer and Sexual Activity  . Alcohol use: Yes    Alcohol/week: 2.0 standard drinks    Types: 2 Cans of beer per week  . Drug use: No  . Sexual activity: Not on file  Lifestyle  . Physical activity:    Days per week: Not on file    Minutes per session: Not on file  . Stress: Not on file  Relationships  . Social connections:    Talks on phone: Not on file    Gets together: Not on  file    Attends religious service: Not on file    Active member of club or organization: Not on file    Attends meetings of clubs or organizations: Not on file    Relationship status: Not on file  . Intimate partner violence:    Fear of current or ex partner: Not on file    Emotionally abused: Not on file    Physically abused: Not on file    Forced sexual activity: Not on file  Other Topics Concern  . Not on file  Social History Narrative  . Not on file    Functional Status Survey:    Family History  Problem Relation Age of Onset  . Stroke Sister   . Heart disease Mother   . Heart attack Mother 9       Deceased  . Hypertension Mother   . Dementia Mother   . Dementia Sister 21       Deceased    Health Maintenance  Topic Date Due  . INFLUENZA VACCINE  10/24/2018 (Originally 07/12/2018)  .  PNA vac Low Risk Adult  Completed  . TETANUS/TDAP  Discontinued    Allergies  Allergen Reactions  . Penicillins Swelling  . Ropinirole Rash    Outpatient Encounter Medications as of 07/26/2018  Medication Sig  . Multiple Vitamin (MULTIVITAMIN) tablet Take 1 tablet by mouth daily.  . Nutritional Supplements (NUTRITIONAL SUPPLEMENT PO) Regular Diet -  Mechanical soft texture, Nectar thickened fluids consistency  . vitamin B-12 (CYANOCOBALAMIN) 1000 MCG tablet Take 1,000 mcg by mouth daily.   No facility-administered encounter medications on file as of 07/26/2018.     Review of Systems  Unable to perform ROS: Patient nonverbal    Vitals:   07/26/18 0921  BP: (!) 154/87  Pulse: 65  Resp: 18  Temp: (!) 97.1 F (36.2 C)  SpO2: 92%  Weight: 144 lb 12.8 oz (65.7 kg)  Height: 5\' 7"  (1.702 m)   Body mass index is 22.68 kg/m. Physical Exam  Constitutional: He is oriented to person, place, and time. He appears well-developed and well-nourished.  Looks well in NAD, sitting in w/c  HENT:  Mouth/Throat: Oropharynx is clear and moist.  Poor dentition; MMM; no oral thrush  Eyes: Pupils are equal, round, and reactive to light. No scleral icterus.  Neck: Neck supple. Carotid bruit is not present. No thyromegaly present.  Cardiovascular: Normal rate, regular rhythm and intact distal pulses. Exam reveals no gallop and no friction rub.  Murmur (1/6 SEM) heard. B/l LE trace LE edema; no calf TTP  Pulmonary/Chest: Effort normal and breath sounds normal. He has no wheezes. He has no rales. He exhibits no tenderness.  Abdominal: Soft. Bowel sounds are normal. He exhibits no distension, no abdominal bruit, no pulsatile midline mass and no mass. There is no hepatomegaly. There is no tenderness. There is no rebound and no guarding.  Musculoskeletal: He exhibits edema (small and large joints).  Lymphadenopathy:    He has no cervical adenopathy.  Neurological: He is alert and oriented to person,  place, and time. He has normal reflexes.  B/l LE rigidity with extremities extended  Skin: Skin is warm and dry. No rash noted.  B/l LE post inflammatory hyperpigmentation  Psychiatric: He has a normal mood and affect. His behavior is normal. Thought content normal. His speech is slurred (and unintelligible). Cognition and memory are impaired.    Labs reviewed: Basic Metabolic Panel: No results for input(s): NA, K, CL, CO2, GLUCOSE, BUN,  CREATININE, CALCIUM, MG, PHOS in the last 8760 hours. Liver Function Tests: No results for input(s): AST, ALT, ALKPHOS, BILITOT, PROT, ALBUMIN in the last 8760 hours. No results for input(s): LIPASE, AMYLASE in the last 8760 hours. No results for input(s): AMMONIA in the last 8760 hours. CBC: No results for input(s): WBC, NEUTROABS, HGB, HCT, MCV, PLT in the last 8760 hours. Cardiac Enzymes: No results for input(s): CKTOTAL, CKMB, CKMBINDEX, TROPONINI in the last 8760 hours. BNP: Invalid input(s): POCBNP Lab Results  Component Value Date   HGBA1C 6.0 (H) 08/09/2013   Lab Results  Component Value Date   TSH 2.503 08/09/2013   No results found for: VITAMINB12 No results found for: FOLATE No results found for: IRON, TIBC, FERRITIN  Imaging and Procedures obtained prior to SNF admission: No results found.  Assessment/Plan   ICD-10-CM   1. PSP (progressive supranuclear palsy) (HCC) G23.1   2. Prostate cancer ([place]) C61   3. Dementia without behavioral disturbance, unspecified dementia type F03.90   4. Anemia, chronic disease D63.8   5. Recurrent falls R29.6   6. Essential hypertension I10    diet controlled     Cont current meds as ordered  PT/OT/ST eval and treat  Please obtain foot rests for w/c for comfort of pt  F/u with specialists as scheduled - especially neurology and urology  GOAL: short term rehab then long term care. Communicated with pt and nursing.  Will follow  Labs/tests ordered: none    Lorre Opdahl S. Perlie Gold  [facility] [address] [place], [place] [address] [phone] Cell (Monday-Friday 8 AM - 5 PM) [phone] After 5 PM and follow prompts

## 2018-07-30 ENCOUNTER — Ambulatory Visit (HOSPITAL_COMMUNITY)
Admission: RE | Admit: 2018-07-30 | Discharge: 2018-07-30 | Disposition: A | Discharge status: Home / Self Care | Payer: Medicare HMO | Source: Ambulatory Visit | Attending: Neurology | Admitting: Neurology

## 2018-07-30 DIAGNOSIS — R1312 Dysphagia, oropharyngeal phase: Secondary | ICD-10-CM | POA: Diagnosis present

## 2018-07-30 DIAGNOSIS — R1319 Other dysphagia: Secondary | ICD-10-CM

## 2018-07-30 DIAGNOSIS — R1310 Dysphagia, unspecified: Secondary | ICD-10-CM

## 2018-07-30 NOTE — Progress Notes (Signed)
Modified Barium Swallow Progress Note  Patient Details  Name: Roger Cohen MRN: [id number] Date of Birth: May 02, 1928  Today's Date: 07/30/2018  Modified Barium Swallow completed.  Full report located under Chart Review in the Imaging Section.  Brief recommendations include the following:  Clinical Impression  Pt has a mild oropharyngeal dysphagia but with no aspiration observed across testing. His oral phase is mildly prolonged with lingual pumping and reduced posterior propulsion; however, this is markedly improved with use of a straw. Swallow initiation is mildly delayed with liquids by cup and solids, again with improved timing observed when drinking via straw. Thin liquids via straw do reach the pyriform sinuses before the swallow, resulting in intermittent penetration that is trace and transient in nature - clearing the laryngeal vestibule upon completion of the swallow. Hyolaryngeal movement appears minimal, but does not impact his functional swallow and therefore could be baseline. No overt esophageal concerns upon brief scan. Recommend advancement up to Dys 3 diet and thin liquids per primary SLP if he has one at [facility].    Swallow Evaluation Recommendations       SLP Diet Recommendations: Dysphagia 3 (Mech soft) solids;Thin liquid   Liquid Administration via: Straw   Medication Administration: Whole meds with puree   Supervision: Staff to assist with self feeding;Full supervision/cueing for compensatory strategies   Compensations: Minimize environmental distractions;Slow rate;Small sips/bites   Postural Changes: Seated upright at 90 degrees   Oral Care Recommendations: Oral care BID        Germain Osgood 07/30/2018,12:32 PM   Germain Osgood, M.A. CCC-SLP [phone]

## 2018-08-02 DIAGNOSIS — I10 Essential (primary) hypertension: Secondary | ICD-10-CM | POA: Insufficient documentation

## 2018-08-03 NOTE — ACP (Advance Care Planning) (Signed)
I have had a prolonged discussion with his wife regarding his code status. We did discuss CPR and projected outcomes. She has decided upon DNR We have discussed future hospitalizations; pros and cons and what can be done at this facility. She has decided not to hospitalize him She does desire for abt and ivf.  She does not want a tube feeding.  His MOST form has been filled out along with a yellow DNR form.

## 2018-08-07 ENCOUNTER — Encounter: Payer: Self-pay | Admitting: Adult Health

## 2018-08-07 ENCOUNTER — Non-Acute Institutional Stay (SKILLED_NURSING_FACILITY): Payer: Medicare HMO | Admitting: Adult Health

## 2018-08-07 DIAGNOSIS — G231 Progressive supranuclear ophthalmoplegia [Steele-Richardson-Olszewski]: Secondary | ICD-10-CM | POA: Diagnosis not present

## 2018-08-07 DIAGNOSIS — I10 Essential (primary) hypertension: Secondary | ICD-10-CM

## 2018-08-07 DIAGNOSIS — F0390 Unspecified dementia without behavioral disturbance: Secondary | ICD-10-CM | POA: Diagnosis not present

## 2018-08-07 NOTE — Progress Notes (Signed)
Location:   [facility] Room Number: 216 A Place of Service:  SNF (31)   CODE STATUS: DNR  Allergies  Allergen Reactions  . Penicillins Swelling  . Ropinirole Rash    Chief Complaint  Patient presents with  . Medical Management of Chronic Issues    Dementia; hypertension; psp. Weekly follow up for the first 30 days post hospitalization.     HPI:  He is an 84 year old rehab patient being seen for the management of his chronic illnesses: dementia; hypertension; psp. He is unable to participate in the hpi or ros. He had been in the ED after his wife was in an MVA. More than likely this does represent a long term placement for him. There are no reports of uncontrolled pain; no signs of anxiety or agitation. There are no nursing concerns at this time.   Past Medical History:  Diagnosis Date  . Chicken pox   . Parkinson's disease ([place])   . Prostate cancer ([place])   . Stroke [facility]) 2012    Past Surgical History:  Procedure Laterality Date  . SINUS ENDO W/FUSION  2011   Infection & Stents placed  . WISDOM TOOTH EXTRACTION      Social History   Socioeconomic History  . Marital status: Married    Spouse name: Not on file  . Number of children: Not on file  . Years of education: Not on file  . Highest education level: Not on file  Occupational History  . Not on file  Social Needs  . Financial resource strain: Not on file  . Food insecurity:    Worry: Not on file    Inability: Not on file  . Transportation needs:    Medical: Not on file    Non-medical: Not on file  Tobacco Use  . Smoking status: Former Smoker    Types: Cigars  . Smokeless tobacco: Former Network engineer and Sexual Activity  . Alcohol use: Yes    Alcohol/week: 2.0 standard drinks    Types: 2 Cans of beer per week  . Drug use: No  . Sexual activity: Not on file  Lifestyle  . Physical activity:    Days per week: Not on file    Minutes per session: Not on file  . Stress: Not on  file  Relationships  . Social connections:    Talks on phone: Not on file    Gets together: Not on file    Attends religious service: Not on file    Active member of club or organization: Not on file    Attends meetings of clubs or organizations: Not on file    Relationship status: Not on file  . Intimate partner violence:    Fear of current or ex partner: Not on file    Emotionally abused: Not on file    Physically abused: Not on file    Forced sexual activity: Not on file  Other Topics Concern  . Not on file  Social History Narrative  . Not on file   Family History  Problem Relation Age of Onset  . Stroke Sister   . Heart disease Mother   . Heart attack Mother 63       Deceased  . Hypertension Mother   . Dementia Mother   . Dementia Sister 84       Deceased      VITAL SIGNS BP (!) 142/87   Pulse 86   Temp (!) 97.5 F (  36.4 C)   Resp 16   Ht 5\' 7"  (1.702 m)   Wt 149 lb (67.6 kg)   BMI 23.34 kg/m   Outpatient Encounter Medications as of 08/07/2018  Medication Sig  . Multiple Vitamin (MULTIVITAMIN) tablet Take 1 tablet by mouth daily.  . Nutritional Supplements (NUTRITIONAL SUPPLEMENT PO) Regular Diet -  Mechanical soft texture, Nectar thickened fluids consistency  . vitamin B-12 (CYANOCOBALAMIN) 1000 MCG tablet Take 1,000 mcg by mouth daily.   No facility-administered encounter medications on file as of 08/07/2018.      SIGNIFICANT DIAGNOSTIC EXAMS  LABS REVIEWED TODAY:   July 2019: PSA 28.06 07-25-18: wbc 4.8; hgb 12.9 hct 39; plt 203; glucose 92; bun 21; creat 0.9; k+ 4.3; na++ 143; liver normal   Review of Systems  Unable to perform ROS: Other (unable to answer questions )    Physical Exam  Constitutional: No distress.  Frail   Neck: No thyromegaly present.  Cardiovascular: Normal rate, regular rhythm, normal heart sounds and intact distal pulses.  Both feet cold to touch   Pulmonary/Chest: Effort normal and breath sounds normal. No respiratory  distress.  Abdominal: Soft. Bowel sounds are normal. He exhibits no distension. There is no tenderness.  Musculoskeletal: He exhibits no edema.  Is able to move all extremities; movements are slow and jerky    Lymphadenopathy:    He has no cervical adenopathy.  Neurological: He is alert.  Skin: Skin is warm and dry. He is not diaphoretic.  Psychiatric: He has a normal mood and affect.      ASSESSMENT/ PLAN:  TODAY:   1. Dementia without behavioral disturbance; is without change: weight is 149 pounds will monitor  2. Essential hypertension; benign: is stable b/p 142/87: will monitor  3. PSP (pregressive supranuclear palsy): no changes in status; currently not on medications will monitor  PREVIOUS   4.  Prostate cancer: is stable PSA 28.06 (July 2019) is followed by urology is currently not on treatment will monitor  5. Anemia, chronic disease:stable hgb 12.9 will continue to monitor    MD is aware of resident's narcotic use and is in agreement with current plan of care. We will attempt to wean resident as apropriate   Ok Edwards NP [facility]  Contact [phone] Monday through Friday 8am- 5pm  After hours call [phone]

## 2018-08-14 ENCOUNTER — Non-Acute Institutional Stay (SKILLED_NURSING_FACILITY): Payer: Medicare HMO | Admitting: Adult Health

## 2018-08-14 ENCOUNTER — Encounter: Payer: Self-pay | Admitting: Adult Health

## 2018-08-14 DIAGNOSIS — C61 Malignant neoplasm of prostate: Secondary | ICD-10-CM | POA: Diagnosis not present

## 2018-08-14 DIAGNOSIS — D638 Anemia in other chronic diseases classified elsewhere: Secondary | ICD-10-CM

## 2018-08-14 DIAGNOSIS — G231 Progressive supranuclear ophthalmoplegia [Steele-Richardson-Olszewski]: Secondary | ICD-10-CM

## 2018-08-14 NOTE — Progress Notes (Signed)
Location:   [facility] Room Number: 216 A Place of Service:  SNF (31)   CODE STATUS: DNR  Allergies  Allergen Reactions  . Penicillins Swelling  . Ropinirole Rash    Chief Complaint  Patient presents with  . Medical Management of Chronic Issues    PSP prostate cancer; anemia. Weekly follow for the first 30 days post admission. / Care Plan Meeting    HPI:  He is a 84 year old short term rehab patient being seen for the management of his chronic illnesses: PSP; prostate cancer; anemia. He is unable to fully participate in the hpi or ros. There are no reports of changes in appetite; no uncontrolled pain; indications of anxiety present.  We have come together for his care plan meeting. He does have family present. We have discussed his medications. His wife states that he was taking high doses of vit B 12; will check level; he was taking mag ox at home; will start this vitamin; he was using castor oil to his eyes twice daily. We have had a prolonged discussion about this; they understand the risks of infection involved; will begin him on castor oil to eye lids twice daily nursing will apply. He is being seen by therapies for self feeding; oral hygiene; swallowing; transfers.   Past Medical History:  Diagnosis Date  . Chicken pox   . Parkinson's disease ([place])   . Prostate cancer ([place])   . Stroke [facility]) 2012    Past Surgical History:  Procedure Laterality Date  . SINUS ENDO W/FUSION  2011   Infection & Stents placed  . WISDOM TOOTH EXTRACTION      Social History   Socioeconomic History  . Marital status: Married    Spouse name: Not on file  . Number of children: Not on file  . Years of education: Not on file  . Highest education level: Not on file  Occupational History  . Not on file  Social Needs  . Financial resource strain: Not on file  . Food insecurity:    Worry: Not on file    Inability: Not on file  . Transportation needs:    Medical: Not on  file    Non-medical: Not on file  Tobacco Use  . Smoking status: Former Smoker    Types: Cigars  . Smokeless tobacco: Former Network engineer and Sexual Activity  . Alcohol use: Yes    Alcohol/week: 2.0 standard drinks    Types: 2 Cans of beer per week  . Drug use: No  . Sexual activity: Not on file  Lifestyle  . Physical activity:    Days per week: Not on file    Minutes per session: Not on file  . Stress: Not on file  Relationships  . Social connections:    Talks on phone: Not on file    Gets together: Not on file    Attends religious service: Not on file    Active member of club or organization: Not on file    Attends meetings of clubs or organizations: Not on file    Relationship status: Not on file  . Intimate partner violence:    Fear of current or ex partner: Not on file    Emotionally abused: Not on file    Physically abused: Not on file    Forced sexual activity: Not on file  Other Topics Concern  . Not on file  Social History Narrative  . Not on file  Family History  Problem Relation Age of Onset  . Stroke Sister   . Heart disease Mother   . Heart attack Mother 110       Deceased  . Hypertension Mother   . Dementia Mother   . Dementia Sister 25       Deceased      VITAL SIGNS BP (!) 142/87   Pulse 86   Temp (!) 97.5 F (36.4 C)   Resp 16   Ht 5\' 7"  (1.702 m)   Wt 152 lb 9.6 oz (69.2 kg)   BMI 23.90 kg/m   Outpatient Encounter Medications as of 08/14/2018  Medication Sig  . Multiple Vitamin (MULTIVITAMIN) tablet Take 1 tablet by mouth daily.  . Nutritional Supplements (NUTRITIONAL SUPPLEMENT PO) Regular Diet -  Mechanical soft texture, Nectar thickened fluids consistency  . vitamin B-12 (CYANOCOBALAMIN) 1000 MCG tablet Take 1,000 mcg by mouth daily.   No facility-administered encounter medications on file as of 08/14/2018.      SIGNIFICANT DIAGNOSTIC EXAMS  LABS REVIEWED PREVIOUS:   July 2019: PSA 28.06 07-25-18: wbc 4.8; hgb 12.9 hct 39;  plt 203; glucose 92; bun 21; creat 0.9; k+ 4.3; na++ 143; liver normal   NO NEW LABS.    Review of Systems  Unable to perform ROS: Other (unable to answer questions )   Physical Exam  Constitutional: No distress.  Frail   Neck: No thyromegaly present.  Cardiovascular: Normal rate, regular rhythm, normal heart sounds and intact distal pulses.  Both feet cold to touch   Pulmonary/Chest: Effort normal and breath sounds normal. No respiratory distress.  Abdominal: Soft. Bowel sounds are normal. He exhibits no distension. There is no tenderness.  Musculoskeletal: He exhibits no edema.  Is able to move all extremities; movements are slow and jerky  Has chronic left shoulder dislocation   Lymphadenopathy:    He has no cervical adenopathy.  Neurological: He is alert.  Skin: Skin is warm and dry. He is not diaphoretic.  Psychiatric: He has a normal mood and affect.    ASSESSMENT/ PLAN:  TODAY:   1. 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  Prostate cancer: is stable PSA 28.06 (July 2019) is followed by urology is currently not on treatment will monitor  3. Anemia, chronic disease:stable hgb 12.9 will continue to monitor   PREVIOUS   4. Dementia without behavioral disturbance; is without change: weight is 152 pounds will monitor  5. Essential hypertension; benign: is stable b/p 142/87: will monitor   Will begin mag ox 400 mg daily  Will begin Alive gummies mens 50+ 3 daily  Will check vit B 12 level Will have nursing apply castor oil to eye lids twice daily  Will continue therapy as directed   Time spent with patient: 45 minutes: discussed medications; therapy and goals; future goals of care. Verbalized understanding.     MD is aware of resident's narcotic use and is in agreement with current plan of care. We will attempt to wean resident as apropriate   Ok Edwards NP [facility]  Contact  [phone] Monday through Friday 8am- 5pm  After hours call [phone]

## 2018-08-15 LAB — VITAMIN B12: VITAMIN B 12: 1694

## 2018-08-21 ENCOUNTER — Encounter: Payer: Self-pay | Admitting: Adult Health

## 2018-08-21 ENCOUNTER — Non-Acute Institutional Stay (SKILLED_NURSING_FACILITY): Payer: Medicare HMO | Admitting: Adult Health

## 2018-08-21 DIAGNOSIS — I10 Essential (primary) hypertension: Secondary | ICD-10-CM

## 2018-08-21 DIAGNOSIS — F0390 Unspecified dementia without behavioral disturbance: Secondary | ICD-10-CM

## 2018-08-21 DIAGNOSIS — D638 Anemia in other chronic diseases classified elsewhere: Secondary | ICD-10-CM | POA: Diagnosis not present

## 2018-08-21 NOTE — Progress Notes (Signed)
Location:   [facility] Room Number: 216 A Place of Service:  SNF (31)   CODE STATUS: DNR  Allergies  Allergen Reactions  . Penicillins Swelling  . Ropinirole Rash    Chief Complaint  Patient presents with  . Medical Management of Chronic Issues    Hypertension; dementia; anemia.     HPI:  He is a 84 year old long term resident of this facility being seen for the management of his chronic illnesses: hypertension; dementia; anemia. He is unable to participate in the hpi or ros. There are no reports of uncontrolled pain; no signs of anxiety; no agitation present. There are no nursing concerns at this time.   Past Medical History:  Diagnosis Date  . Chicken pox   . Parkinson's disease ([place])   . Prostate cancer ([place])   . Stroke [facility]) 2012    Past Surgical History:  Procedure Laterality Date  . SINUS ENDO W/FUSION  2011   Infection & Stents placed  . WISDOM TOOTH EXTRACTION      Social History   Socioeconomic History  . Marital status: Married    Spouse name: Not on file  . Number of children: Not on file  . Years of education: Not on file  . Highest education level: Not on file  Occupational History  . Not on file  Social Needs  . Financial resource strain: Not on file  . Food insecurity:    Worry: Not on file    Inability: Not on file  . Transportation needs:    Medical: Not on file    Non-medical: Not on file  Tobacco Use  . Smoking status: Former Smoker    Types: Cigars  . Smokeless tobacco: Former Network engineer and Sexual Activity  . Alcohol use: Yes    Alcohol/week: 2.0 standard drinks    Types: 2 Cans of beer per week  . Drug use: No  . Sexual activity: Not on file  Lifestyle  . Physical activity:    Days per week: Not on file    Minutes per session: Not on file  . Stress: Not on file  Relationships  . Social connections:    Talks on phone: Not on file    Gets together: Not on file    Attends religious service: Not on  file    Active member of club or organization: Not on file    Attends meetings of clubs or organizations: Not on file    Relationship status: Not on file  . Intimate partner violence:    Fear of current or ex partner: Not on file    Emotionally abused: Not on file    Physically abused: Not on file    Forced sexual activity: Not on file  Other Topics Concern  . Not on file  Social History Narrative  . Not on file   Family History  Problem Relation Age of Onset  . Stroke Sister   . Heart disease Mother   . Heart attack Mother 40       Deceased  . Hypertension Mother   . Dementia Mother   . Dementia Sister 79       Deceased      VITAL SIGNS BP (!) 152/80   Pulse 67   Temp (!) 97.1 F (36.2 C)   Resp 18   Ht 5\' 7"  (1.702 m)   Wt 154 lb (69.9 kg)   SpO2 97%   BMI 24.12 kg/m  Outpatient Encounter Medications as of 08/21/2018  Medication Sig  . magnesium oxide (MAG-OX) 400 MG tablet Take 400 mg by mouth daily.  . Multiple Vitamin (MULTIVITAMIN) tablet Take 1 tablet by mouth daily.  . Nutritional Supplements (NUTRITIONAL SUPPLEMENT PO) Regular Diet -  Mechanical soft texture, Thin liquid consistency  . [DISCONTINUED] vitamin B-12 (CYANOCOBALAMIN) 1000 MCG tablet Take 1,000 mcg by mouth daily.   No facility-administered encounter medications on file as of 08/21/2018.      SIGNIFICANT DIAGNOSTIC EXAMS   LABS REVIEWED PREVIOUS:   July 2019: PSA 28.06 07-25-18: wbc 4.8; hgb 12.9 hct 39; plt 203; glucose 92; bun 21; creat 0.9; k+ 4.3; na++ 143; liver normal   TODAY:   08-15-18: vit B 12: 1694   Review of Systems  Unable to perform ROS: Dementia (uanble to participate )    Physical Exam  Constitutional: He appears well-developed and well-nourished. No distress.  Thin   Neck: No thyromegaly present.  Cardiovascular: Normal rate, regular rhythm, normal heart sounds and intact distal pulses.  Pulmonary/Chest: Effort normal and breath sounds normal. No respiratory  distress.  Abdominal: Soft. Bowel sounds are normal. He exhibits no distension. There is no tenderness.  Musculoskeletal: He exhibits no edema.  Rigidity present   Lymphadenopathy:    He has no cervical adenopathy.  Neurological: He is alert.  Skin: Skin is warm and dry. He is not diaphoretic.  Psychiatric: He has a normal mood and affect.     ASSESSMENT/ PLAN:  TODAY:   1. Anemia, chronic disease:stable hgb 12.9 will continue to monitor   2. Dementia without behavioral disturbance; is without change: weight is 154 pounds will monitor  3. Essential hypertension; benign: is stable b/p 152/80: will monitor  PREVIOUS   4. 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  Prostate cancer: is stable PSA 28.06 (July 2019) is followed by urology is currently not on treatment will monitor    MD is aware of resident's narcotic use and is in agreement with current plan of care. We will attempt to wean resident as apropriate   Ok Edwards NP [facility]  Contact [phone] Monday through Friday 8am- 5pm  After hours call [phone]

## 2018-08-27 ENCOUNTER — Non-Acute Institutional Stay (SKILLED_NURSING_FACILITY): Payer: Medicare HMO

## 2018-08-27 DIAGNOSIS — Z0000 Encounter for general adult medical examination without abnormal findings: Secondary | ICD-10-CM | POA: Diagnosis not present

## 2018-08-27 NOTE — Patient Instructions (Signed)
Roger Cohen , Thank you for taking time to come for your Medicare Wellness Visit. I appreciate your ongoing commitment to your health goals. Please review the following plan we discussed and let me know if I can assist you in the future.   Screening recommendations/referrals: Colonoscopy excluded, over age 82 Recommended yearly ophthalmology/optometry visit for glaucoma screening and checkup Recommended yearly dental visit for hygiene and checkup  Vaccinations: Influenza vaccine due, will receive at [facility] Pneumococcal vaccine up to date, completed Tdap vaccine excluded Shingles vaccine not in past records    Advanced directives: in chart  Conditions/risks identified: none  Next appointment: Dr. Eulas Post makes rounds  Preventive Care 23 Years and Older, Male Preventive care refers to lifestyle choices and visits with your health care provider that can promote health and wellness. What does preventive care include?  A yearly physical exam. This is also called an annual well check.  Dental exams once or twice a year.  Routine eye exams. Ask your health care provider how often you should have your eyes checked.  Personal lifestyle choices, including:  Daily care of your teeth and gums.  Regular physical activity.  Eating a healthy diet.  Avoiding tobacco and drug use.  Limiting alcohol use.  Practicing safe sex.  Taking low doses of aspirin every day.  Taking vitamin and mineral supplements as recommended by your health care provider. What happens during an annual well check? The services and screenings done by your health care provider during your annual well check will depend on your age, overall health, lifestyle risk factors, and family history of disease. Counseling  Your health care provider may ask you questions about your:  Alcohol use.  Tobacco use.  Drug use.  Emotional well-being.  Home and relationship well-being.  Sexual activity.  Eating  habits.  History of falls.  Memory and ability to understand (cognition).  Work and work Statistician. Screening  You may have the following tests or measurements:  Height, weight, and BMI.  Blood pressure.  Lipid and cholesterol levels. These may be checked every 5 years, or more frequently if you are over 14 years old.  Skin check.  Lung cancer screening. You may have this screening every year starting at age 59 if you have a 30-pack-year history of smoking and currently smoke or have quit within the past 15 years.  Fecal occult blood test (FOBT) of the stool. You may have this test every year starting at age 21.  Flexible sigmoidoscopy or colonoscopy. You may have a sigmoidoscopy every 5 years or a colonoscopy every 10 years starting at age 42.  Prostate cancer screening. Recommendations will vary depending on your family history and other risks.  Hepatitis C blood test.  Hepatitis B blood test.  Sexually transmitted disease (STD) testing.  Diabetes screening. This is done by checking your blood sugar (glucose) after you have not eaten for a while (fasting). You may have this done every 1-3 years.  Abdominal aortic aneurysm (AAA) screening. You may need this if you are a current or former smoker.  Osteoporosis. You may be screened starting at age 27 if you are at high risk. Talk with your health care provider about your test results, treatment options, and if necessary, the need for more tests. Vaccines  Your health care provider may recommend certain vaccines, such as:  Influenza vaccine. This is recommended every year.  Tetanus, diphtheria, and acellular pertussis (Tdap, Td) vaccine. You may need a Td booster every 10 years.  Zoster vaccine. You may need this after age 61.  Pneumococcal 13-valent conjugate (PCV13) vaccine. One dose is recommended after age 41.  Pneumococcal polysaccharide (PPSV23) vaccine. One dose is recommended after age 64. Talk to your health  care provider about which screenings and vaccines you need and how often you need them. This information is not intended to replace advice given to you by your health care provider. Make sure you discuss any questions you have with your health care provider. Document Released: 12/25/2015 Document Revised: 08/17/2016 Document Reviewed: 09/29/2015 Elsevier Interactive Patient Education  2017 [place] Prevention in the Home Falls can cause injuries. They can happen to people of all ages. There are many things you can do to make your home safe and to help prevent falls. What can I do on the outside of my home?  Regularly fix the edges of walkways and driveways and fix any cracks.  Remove anything that might make you trip as you walk through a door, such as a raised step or threshold.  Trim any bushes or trees on the path to your home.  Use bright outdoor lighting.  Clear any walking paths of anything that might make someone trip, such as rocks or tools.  Regularly check to see if handrails are loose or broken. Make sure that both sides of any steps have handrails.  Any raised decks and porches should have guardrails on the edges.  Have any leaves, snow, or ice cleared regularly.  Use sand or salt on walking paths during winter.  Clean up any spills in your garage right away. This includes oil or grease spills. What can I do in the bathroom?  Use night lights.  Install grab bars by the toilet and in the tub and shower. Do not use towel bars as grab bars.  Use non-skid mats or decals in the tub or shower.  If you need to sit down in the shower, use a plastic, non-slip stool.  Keep the floor dry. Clean up any water that spills on the floor as soon as it happens.  Remove soap buildup in the tub or shower regularly.  Attach bath mats securely with double-sided non-slip rug tape.  Do not have throw rugs and other things on the floor that can make you trip. What can I do  in the bedroom?  Use night lights.  Make sure that you have a light by your bed that is easy to reach.  Do not use any sheets or blankets that are too big for your bed. They should not hang down onto the floor.  Have a firm chair that has side arms. You can use this for support while you get dressed.  Do not have throw rugs and other things on the floor that can make you trip. What can I do in the kitchen?  Clean up any spills right away.  Avoid walking on wet floors.  Keep items that you use a lot in easy-to-reach places.  If you need to reach something above you, use a strong step stool that has a grab bar.  Keep electrical cords out of the way.  Do not use floor polish or wax that makes floors slippery. If you must use wax, use non-skid floor wax.  Do not have throw rugs and other things on the floor that can make you trip. What can I do with my stairs?  Do not leave any items on the stairs.  Make sure that there are  handrails on both sides of the stairs and use them. Fix handrails that are broken or loose. Make sure that handrails are as long as the stairways.  Check any carpeting to make sure that it is firmly attached to the stairs. Fix any carpet that is loose or worn.  Avoid having throw rugs at the top or bottom of the stairs. If you do have throw rugs, attach them to the floor with carpet tape.  Make sure that you have a light switch at the top of the stairs and the bottom of the stairs. If you do not have them, ask someone to add them for you. What else can I do to help prevent falls?  Wear shoes that:  Do not have high heels.  Have rubber bottoms.  Are comfortable and fit you well.  Are closed at the toe. Do not wear sandals.  If you use a stepladder:  Make sure that it is fully opened. Do not climb a closed stepladder.  Make sure that both sides of the stepladder are locked into place.  Ask someone to hold it for you, if possible.  Clearly mark  and make sure that you can see:  Any grab bars or handrails.  First and last steps.  Where the edge of each step is.  Use tools that help you move around (mobility aids) if they are needed. These include:  Canes.  Walkers.  Scooters.  Crutches.  Turn on the lights when you go into a dark area. Replace any light bulbs as soon as they burn out.  Set up your furniture so you have a clear path. Avoid moving your furniture around.  If any of your floors are uneven, fix them.  If there are any pets around you, be aware of where they are.  Review your medicines with your doctor. Some medicines can make you feel dizzy. This can increase your chance of falling. Ask your doctor what other things that you can do to help prevent falls. This information is not intended to replace advice given to you by your health care provider. Make sure you discuss any questions you have with your health care provider. Document Released: 09/24/2009 Document Revised: 05/05/2016 Document Reviewed: 01/02/2015 Elsevier Interactive Patient Education  2017 [employer].

## 2018-08-27 NOTE — Progress Notes (Signed)
Subjective:   Roger Cohen is a 84 y.o. male who presents for an Initial Medicare Annual Wellness Visit at [facility]; incapacitated patient unable to answer questions appropriately    Objective:    Today's Vitals   08/27/18 1159  BP: 140/86  Pulse: 71  Temp: 97.8 F (36.6 C)  TempSrc: Oral  Weight: 154 lb (69.9 kg)  Height: 5\' 7"  (1.702 m)   Body mass index is 24.12 kg/m.  Advanced Directives 08/27/2018 08/21/2018 08/14/2018 08/07/2018 07/26/2018 07/25/2018 07/24/2018  Does Patient Have a Medical Advance Directive? Yes Yes Yes Yes No No No  Type of Advance Directive Out of facility DNR (pink MOST or yellow form) Out of facility DNR (pink MOST or yellow form) Out of facility DNR (pink MOST or yellow form) Out of facility DNR (pink MOST or yellow form) - - -  Does patient want to make changes to medical advance directive? No - Patient declined No - Patient declined No - Patient declined No - Patient declined - - -  Would patient like information on creating a medical advance directive? No - Patient declined No - Patient declined No - Patient declined No - Patient declined No - Patient declined No - Patient declined No - Patient declined  Pre-existing out of facility DNR order (yellow form or pink MOST form) Yellow form placed in chart (order not valid for inpatient use) Yellow form placed in chart (order not valid for inpatient use) Yellow form placed in chart (order not valid for inpatient use) Yellow form placed in chart (order not valid for inpatient use) - - -    Current Medications (verified) Outpatient Encounter Medications as of 08/27/2018  Medication Sig  . magnesium oxide (MAG-OX) 400 MG tablet Take 400 mg by mouth daily.  . Multiple Vitamin (MULTIVITAMIN) tablet Take 1 tablet by mouth daily.  . Nutritional Supplements (NUTRITIONAL SUPPLEMENT PO) Regular Diet -  Mechanical soft texture, Thin liquid consistency   No facility-administered encounter medications  on file as of 08/27/2018.     Allergies (verified) Penicillins and Ropinirole   History: Past Medical History:  Diagnosis Date  . Chicken pox   . Parkinson's disease ([place])   . Prostate cancer ([place])   . Stroke [facility]) 2012   Past Surgical History:  Procedure Laterality Date  . SINUS ENDO W/FUSION  2011   Infection & Stents placed  . WISDOM TOOTH EXTRACTION     Family History  Problem Relation Age of Onset  . Stroke Sister   . Heart disease Mother   . Heart attack Mother 42       Deceased  . Hypertension Mother   . Dementia Mother   . Dementia Sister 75       Deceased   Social History   Socioeconomic History  . Marital status: Married    Spouse name: Not on file  . Number of children: Not on file  . Years of education: Not on file  . Highest education level: Not on file  Occupational History  . Not on file  Social Needs  . Financial resource strain: Not on file  . Food insecurity:    Worry: Not on file    Inability: Not on file  . Transportation needs:    Medical: Not on file    Non-medical: Not on file  Tobacco Use  . Smoking status: Former Smoker    Types: Cigars  . Smokeless tobacco: Former Network engineer and Sexual Activity  .  Alcohol use: Yes    Alcohol/week: 2.0 standard drinks    Types: 2 Cans of beer per week  . Drug use: No  . Sexual activity: Not on file  Lifestyle  . Physical activity:    Days per week: Not on file    Minutes per session: Not on file  . Stress: Not on file  Relationships  . Social connections:    Talks on phone: Not on file    Gets together: Not on file    Attends religious service: Not on file    Active member of club or organization: Not on file    Attends meetings of clubs or organizations: Not on file    Relationship status: Not on file  Other Topics Concern  . Not on file  Social History Narrative  . Not on file   Tobacco Counseling Counseling given: Not Answered   Clinical Intake:  Pre-visit preparation  completed: No  Pain : Faces Faces Pain Scale: No hurt  Faces Pain Scale: No hurt  Nutritional Risks: None Diabetes: No  How often do you need to have someone help you when you read instructions, pamphlets, or other written materials from your doctor or pharmacy?: 4 - Often(dementia)  Interpreter Needed?: No  Information entered by :: Tyson Dense, RN  Activities of Daily Living In your present state of health, do you have any difficulty performing the following activities: 08/27/2018  Hearing? Y  Vision? N  Difficulty concentrating or making decisions? Y  Walking or climbing stairs? Y  Dressing or bathing? Y  Doing errands, shopping? Y  Preparing Food and eating ? Y  Using the Toilet? Y  In the past six months, have you accidently leaked urine? Y  Do you have problems with loss of bowel control? Y  Managing your Medications? Y  Managing your Finances? Y  Housekeeping or managing your Housekeeping? Y  Some recent data might be hidden     Immunizations and Health Maintenance Immunization History  Administered Date(s) Administered  . Influenza-Unspecified 09/04/2014, 01/19/2016  . Pneumococcal Conjugate-13 04/26/2017  . Pneumococcal Polysaccharide-23 01/19/2016   There are no preventive care reminders to display for this patient.  Patient Care Team: Grant as PCP - General  Indicate any recent Medical Services you may have received from other than [facility] providers in the past year (date may be approximate).    Assessment:   This is a routine wellness examination for Roger Cohen.  Hearing/Vision screen No exam data present  Dietary issues and exercise activities discussed: Current Exercise Habits: The patient does not participate in regular exercise at present, Exercise limited by: neurologic condition(s);orthopedic condition(s)  Goals   None    Depression Screen PHQ 2/9 Scores 08/27/2018 08/05/2013  PHQ - 2 Score - 0  Exception Documentation  Medical reason -    Fall Risk Fall Risk  08/27/2018 07/16/2018 08/05/2013  Falls in the past year? Yes Yes Yes  Number falls in past yr: 1 2 or more 2 or more  Injury with Fall? Yes No -  Risk Factor Category  - High Fall Risk High Fall Risk  Risk for fall due to : - - Impaired balance/gait  Follow up - Falls evaluation completed -    Is the patient's home free of loose throw rugs in walkways, pet beds, electrical cords, etc?   yes      Grab bars in the bathroom? yes      Handrails on the stairs?   yes  Adequate lighting?   yes  Timed Get Up and Go performed: patient is unambulatory  Cognitive Function: MMSE - Mini Mental State Exam 08/27/2018  Not completed: Unable to complete        Screening Tests Health Maintenance  Topic Date Due  . INFLUENZA VACCINE  10/24/2018 (Originally 07/12/2018)  . PNA vac Low Risk Adult  Completed  . TETANUS/TDAP  Discontinued    Qualifies for Shingles Vaccine? Not in past records  Cancer Screenings: Lung: Low Dose CT Chest recommended if Age 86-80 years, 30 pack-year currently smoking OR have quit w/in 15years. Patient does not qualify. Colorectal: up to date  Additional Screenings:  Hepatitis C Screening: unable to appropriately accept or decline Flu vaccine due: will receive at [facility]:    I have personally reviewed and addressed the Medicare Annual Wellness questionnaire and have noted the following in the patient's chart:  A. Medical and social history B. Use of alcohol, tobacco or illicit drugs  C. Current medications and supplements D. Functional ability and status E.  Nutritional status F.  Physical activity G. Advance directives H. List of other physicians I.  Hospitalizations, surgeries, and ER visits in previous 12 months J.  [place] to include hearing, vision, cognitive, depression L. Referrals and appointments - none  In addition, I am unable to review and discuss with incapacitated  patient certain preventive protocols, quality metrics, and best practice recommendations. A written personalized care plan for preventive services as well as general preventive health recommendations were provided to patient.   See attached scanned questionnaire for additional information.   Signed,   Tyson Dense, RN Nurse Health Advisor

## 2018-08-31 ENCOUNTER — Non-Acute Institutional Stay (SKILLED_NURSING_FACILITY): Payer: Medicare HMO | Admitting: Adult Health

## 2018-08-31 ENCOUNTER — Encounter: Payer: Self-pay | Admitting: Adult Health

## 2018-08-31 DIAGNOSIS — G231 Progressive supranuclear ophthalmoplegia [Steele-Richardson-Olszewski]: Secondary | ICD-10-CM | POA: Diagnosis not present

## 2018-08-31 DIAGNOSIS — I10 Essential (primary) hypertension: Secondary | ICD-10-CM | POA: Diagnosis not present

## 2018-08-31 DIAGNOSIS — C61 Malignant neoplasm of prostate: Secondary | ICD-10-CM

## 2018-08-31 NOTE — Progress Notes (Signed)
Location:   [place] of Service:   [place]: full code   Allergies  Allergen Reactions  . Penicillins Swelling  . Ropinirole Rash    Chief Complaint  Patient presents with  . Medical Management of Chronic Issues    Hypertension; psp; prostate cancer . Weekly follow up for the first 30 days post hospitalization     HPI:  He is a 84 year old long term resident of this facility being seen for the management of his chronic illnesses: hypertension; psp; prostate cancer. He is unable to participate in the hpi or ros. There are no reports of changes in appetite; no signs of anxiety or agitation; no uncontrolled pain.   Past Medical History:  Diagnosis Date  . Chicken pox   . Parkinson's disease ([place])   . Prostate cancer ([place])   . Stroke [facility]) 2012    Past Surgical History:  Procedure Laterality Date  . SINUS ENDO W/FUSION  2011   Infection & Stents placed  . WISDOM TOOTH EXTRACTION      Social History   Socioeconomic History  . Marital status: Married    Spouse name: Not on file  . Number of children: Not on file  . Years of education: Not on file  . Highest education level: Not on file  Occupational History  . Not on file  Social Needs  . Financial resource strain: Not on file  . Food insecurity:    Worry: Not on file    Inability: Not on file  . Transportation needs:    Medical: Not on file    Non-medical: Not on file  Tobacco Use  . Smoking status: Former Smoker    Types: Cigars  . Smokeless tobacco: Former Network engineer and Sexual Activity  . Alcohol use: Yes    Alcohol/week: 2.0 standard drinks    Types: 2 Cans of beer per week  . Drug use: No  . Sexual activity: Not on file  Lifestyle  . Physical activity:    Days per week: Not on file    Minutes per session: Not on file  . Stress: Not on file  Relationships  . Social connections:    Talks on phone: Not on file    Gets together: Not on file    Attends religious  service: Not on file    Active member of club or organization: Not on file    Attends meetings of clubs or organizations: Not on file    Relationship status: Not on file  . Intimate partner violence:    Fear of current or ex partner: Not on file    Emotionally abused: Not on file    Physically abused: Not on file    Forced sexual activity: Not on file  Other Topics Concern  . Not on file  Social History Narrative  . Not on file   Family History  Problem Relation Age of Onset  . Stroke Sister   . Heart disease Mother   . Heart attack Mother 58       Deceased  . Hypertension Mother   . Dementia Mother   . Dementia Sister 30       Deceased      VITAL SIGNS BP (!) 162/80   Pulse 74   Temp 98.3 F (36.8 C)   Resp 18   Ht 5\' 7"  (1.702 m)   Wt 153 lb 6.4 oz (69.6 kg)  SpO2 92%   BMI 24.03 kg/m   Outpatient Encounter Medications as of 08/31/2018  Medication Sig  . UNABLE TO FIND Apply 1 application topically 2 (two) times daily. Apply castor oil to bilateral eyelids twice daily  . magnesium oxide (MAG-OX) 400 MG tablet Take 400 mg by mouth daily.  . Multiple Vitamin (MULTIVITAMIN) tablet Take 1 tablet by mouth daily.  . Nutritional Supplements (NUTRITIONAL SUPPLEMENT PO) Regular Diet -  Mechanical soft texture, Thin liquid consistency   No facility-administered encounter medications on file as of 08/31/2018.      SIGNIFICANT DIAGNOSTIC EXAMS   LABS REVIEWED PREVIOUS:   July 2019: PSA 28.06 07-25-18: wbc 4.8; hgb 12.9 hct 39; plt 203; glucose 92; bun 21; creat 0.9; k+ 4.3; na++ 143; liver normal  08-15-18: vit B 12: 1694  NO NEW LABS.   Review of Systems  Unable to perform ROS: Dementia (unable to participate )   Physical Exam  Constitutional: He appears well-developed and well-nourished. No distress.  Thin   Neck: No thyromegaly present.  Cardiovascular: Normal rate, regular rhythm, normal heart sounds and intact distal pulses.  Pulmonary/Chest: Effort normal  and breath sounds normal. No respiratory distress.  Abdominal: Soft. Bowel sounds are normal. He exhibits no distension. There is no tenderness.  Musculoskeletal: He exhibits no edema.  Is able to move all extremities Has rigidity present   Lymphadenopathy:    He has no cervical adenopathy.  Neurological: He is alert.  Skin: Skin is warm and dry. He is not diaphoretic.  Psychiatric: He has a normal mood and affect.    ASSESSMENT/ PLAN:  TODAY:   1. Essential hypertension; benign: is stable b/p 162/80: will monitor  2. PSP (pregressive supranuclear palsy): no changes in status; currently not on medications will monitor  3.  Prostate cancer: is stable PSA 28.06 (July 2019) is followed by urology is currently not on treatment will monitor  PREVIOUS   4. Anemia, chronic disease:stable hgb 12.9 will continue to monitor   5. Dementia without behavioral disturbance; is without change: weight is 153 pounds will monitor    MD is aware of resident's narcotic use and is in agreement with current plan of care. We will attempt to wean resident as apropriate   Ok Edwards NP [facility]  Contact [phone] Monday through Friday 8am- 5pm  After hours call [phone]

## 2018-09-26 ENCOUNTER — Encounter: Payer: Self-pay | Admitting: Adult Health

## 2018-09-26 ENCOUNTER — Non-Acute Institutional Stay (SKILLED_NURSING_FACILITY): Payer: Medicare HMO | Admitting: Adult Health

## 2018-09-26 DIAGNOSIS — D638 Anemia in other chronic diseases classified elsewhere: Secondary | ICD-10-CM | POA: Diagnosis not present

## 2018-09-26 DIAGNOSIS — F0390 Unspecified dementia without behavioral disturbance: Secondary | ICD-10-CM

## 2018-09-26 DIAGNOSIS — I10 Essential (primary) hypertension: Secondary | ICD-10-CM

## 2018-09-26 NOTE — Progress Notes (Signed)
Location:   [facility] Room Number: [place] of Service:  SNF (31)   CODE STATUS: Full Code  Allergies  Allergen Reactions  . Penicillins Swelling  . Ropinirole Rash    Chief Complaint  Patient presents with  . Medical Management of Chronic Issues    Essential hypertension; benign; dementia without behavioral disturbance unspecified dementia type; anemia, chronic disease.     HPI:  He is a 84 year old long term resident of this facility being seen for the management of his chronic illnesses: hypertension; dementia; anemia. He is unable to participate in the hpi or ros; but did tell me that he feels good. There are no reports of any uncontrolled pain; no changes in appetite; no anxiety of agitation present.   Past Medical History:  Diagnosis Date  . Chicken pox   . Parkinson's disease ([place])   . Prostate cancer ([place])   . Stroke [facility]) 2012    Past Surgical History:  Procedure Laterality Date  . SINUS ENDO W/FUSION  2011   Infection & Stents placed  . WISDOM TOOTH EXTRACTION      Social History   Socioeconomic History  . Marital status: Married    Spouse name: Not on file  . Number of children: Not on file  . Years of education: Not on file  . Highest education level: Not on file  Occupational History  . Not on file  Social Needs  . Financial resource strain: Not on file  . Food insecurity:    Worry: Not on file    Inability: Not on file  . Transportation needs:    Medical: Not on file    Non-medical: Not on file  Tobacco Use  . Smoking status: Former Smoker    Types: Cigars  . Smokeless tobacco: Former Network engineer and Sexual Activity  . Alcohol use: Yes    Alcohol/week: 2.0 standard drinks    Types: 2 Cans of beer per week  . Drug use: No  . Sexual activity: Not on file  Lifestyle  . Physical activity:    Days per week: Not on file    Minutes per session: Not on file  . Stress: Not on file  Relationships  . Social  connections:    Talks on phone: Not on file    Gets together: Not on file    Attends religious service: Not on file    Active member of club or organization: Not on file    Attends meetings of clubs or organizations: Not on file    Relationship status: Not on file  . Intimate partner violence:    Fear of current or ex partner: Not on file    Emotionally abused: Not on file    Physically abused: Not on file    Forced sexual activity: Not on file  Other Topics Concern  . Not on file  Social History Narrative  . Not on file   Family History  Problem Relation Age of Onset  . Stroke Sister   . Heart disease Mother   . Heart attack Mother 93       Deceased  . Hypertension Mother   . Dementia Mother   . Dementia Sister 79       Deceased      VITAL SIGNS Ht 5\' 7"  (1.702 m)   Wt 163 lb 3.2 oz (74 kg)   BMI 25.56 kg/m   Outpatient Encounter Medications as of 09/26/2018  Medication  Sig  . magnesium oxide (MAG-OX) 400 MG tablet Take 400 mg by mouth daily.  . Multiple Vitamin (MULTIVITAMIN) tablet Take 1 tablet by mouth daily.  . Nutritional Supplements (NUTRITIONAL SUPPLEMENT PO) Regular Diet -  Mechanical soft texture, Thin liquid consistency  . UNABLE TO FIND Apply 1 application topically 2 (two) times daily. Apply castor oil to bilateral eyelids twice daily   No facility-administered encounter medications on file as of 09/26/2018.      SIGNIFICANT DIAGNOSTIC EXAMS  LABS REVIEWED PREVIOUS:   July 2019: PSA 28.06 07-25-18: wbc 4.8; hgb 12.9 hct 39; plt 203; glucose 92; bun 21; creat 0.9; k+ 4.3; na++ 143; liver normal  08-15-18: vit B 12: 1694  NO NEW LABS.    Review of Systems  Unable to perform ROS: Dementia (unable to participate )    Physical Exam  Constitutional: He appears well-developed and well-nourished. No distress.  Thin   Neck: No thyromegaly present.  Cardiovascular: Normal rate, regular rhythm, normal heart sounds and intact distal pulses.    Pulmonary/Chest: Effort normal and breath sounds normal. No respiratory distress.  Abdominal: Soft. Bowel sounds are normal. He exhibits no distension. There is no tenderness.  Musculoskeletal: He exhibits no edema.  Is able to move all extremities Has rigidity present    Lymphadenopathy:    He has no cervical adenopathy.  Neurological: He is alert.  Skin: Skin is warm and dry. He is not diaphoretic.  Psychiatric: He has a normal mood and affect.    ASSESSMENT/ PLAN:  TODAY:   1. Anemia, chronic disease:stable hgb 12.9 will continue to monitor   2. Dementia without behavioral disturbance; is without change: weight is 163 pounds will monitor  3. Essential hypertension; benign: is stable b/p 159/69: will monitor  PREVIOUS   4. 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  Prostate cancer: is stable PSA 28.06 (July 2019) is followed by urology is currently not on treatment will monitor   MD is aware of resident's narcotic use and is in agreement with current plan of care. We will attempt to wean resident as apropriate   Ok Edwards NP [facility] Adult Medicine  Contact [phone] Monday through Friday 8am- 5pm  After hours call [phone]

## 2018-09-27 ENCOUNTER — Encounter: Payer: Self-pay | Admitting: Adult Health

## 2019-01-16 NOTE — Progress Notes (Deleted)
Roger Cohen was seen today in the movement disorders clinic for neurologic consultation..  The consultation is for the evaluation of "Parkinsons."  The records that have been made available to me are reviewed in detail.  The patient has been seen both at [facility] as well as locally in [facility] by Dr. Everette Rank.  The history is supplied by the wife as patient cannot tell his own hx.  The patient was first seen by Dr. Anna Genre at [facility] in March, 2016.  Patient has had symptoms since 2013.  First symptom was walking and balance issues.  By 2014, he developed frequent falls.  By the time he saw Dr. Anna Genre in 2016, he was falling 3-4 times per week.  Dr. Anna Genre felt that the patient did not have Parkinson's disease, given lack of response to levodopa (was on carbidopa/levodopa 25/100, 2 po tid).  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  MRI was obtained due to assess for this.  2016 MRI reports indicate minimal T2 hyperintensities.  There is also severe cervical spine stenosis at C2-C3.  His first recommendation was a weaning of the ropinirole because of lower extremity edema that the patient was experiencing.  Following that, he recommended weaning the levodopa.  A trial of baclofen was recommended.  It was also recommended that he see a spine surgeon given the spinal stenosis.  Patient did not tolerate the baclofen due to lethargy.  This was discontinued.  When seen at [facility] in 05/2016, he had Botox for eyelid opening apraxia (wife not sure that it helped).  It was again recommended that he see a spine surgeon for the cervical stenosis.  He followed back up in October, 2017 and was given a prescription for Zanaflex and referral to neurosurgery was provided.  This was his last visit at [facility].  He transitioned care to [facility] and was seen by Dr. Everette Rank starting in 03/2017.  He was last seen there in November, 2018.  He has not seen a neurologist since.  His wife did call the neurology office in  March, 2019 with hallucinations.  Low-dose Xanax was recommended, but his wife reported that she had a difficult time getting the patient to swallow pills and she was going to try hemp instead.  It is noted that the patient was seen in the emergency room in May, 2019 with complaints of the left shoulder not appearing normal for several weeks.  He was diagnosed with a dislocated shoulder from falls.     Specific Symptoms:  Tremor: No. Family hx of similar:  No. Voice: garbled speech/babbling speech per wife Sleep: sleeps ok  Vivid Dreams:  Yes.    Acting out dreams:  No. Wet Pillows: No. Postural symptoms:  Yes.  , no walking since March-April, 2019.  States that he was approved for [facility] but hasn't gotten that.  Falls?  Yes.  , when transferring with wife.  Wife/pt both fell recently during transfer. Loss of smell:  No. Urinary Incontinence:  Yes.   Difficulty Swallowing:  Yes.  , especially drinks.  Uses thick it in food.  He doesn't like it when it is put in the water. Trouble with ADL's:  Yes.  , wife does it all.  Pays for bath at the day center.  Goes to day center 5 days per week, from 8:30-5:30.  No home caregiving Memory changes:  Yes.   Hallucinations:  Yes.   but rarely N/V:  No. Lightheaded:  No.  Syncope: No. Diplopia:  No. Dyskinesia:  No.  01/18/19 update:  Pt f/u today for PSP.    The records that were made available to me were reviewed.  Pt was in [facility] day program but ultimately was too high level of care for them due to trouble with transfers.  Then, pts wife broke her arm in an MVA, didn't show up to pick him up from the daycare and EMS picked him up and brought him to the hospital.  Pt ended up at [facility].   PREVIOUS MEDICATIONS: Sinemet and Requip  ALLERGIES:   Allergies  Allergen Reactions  . Penicillins Swelling  . Ropinirole Rash    CURRENT MEDICATIONS:  Outpatient Encounter Medications as of 01/18/2019  Medication Sig  . magnesium oxide (MAG-OX) 400 MG  tablet Take 400 mg by mouth daily.  . Multiple Vitamin (MULTIVITAMIN) tablet Take 1 tablet by mouth daily.  . Nutritional Supplements (NUTRITIONAL SUPPLEMENT PO) Regular Diet -  Mechanical soft texture, Thin liquid consistency  . UNABLE TO FIND Apply 1 application topically 2 (two) times daily. Apply castor oil to bilateral eyelids twice daily   No facility-administered encounter medications on file as of 01/18/2019.     PAST MEDICAL HISTORY:   Past Medical History:  Diagnosis Date  . Chicken pox   . Parkinson's disease ([place])   . Prostate cancer ([place])   . Stroke [facility]) 2012    PAST SURGICAL HISTORY:   Past Surgical History:  Procedure Laterality Date  . SINUS ENDO W/FUSION  2011   Infection & Stents placed  . WISDOM TOOTH EXTRACTION      SOCIAL HISTORY:   Social History   Socioeconomic History  . Marital status: Married    Spouse name: Not on file  . Number of children: Not on file  . Years of education: Not on file  . Highest education level: Not on file  Occupational History  . Not on file  Social Needs  . Financial resource strain: Not on file  . Food insecurity:    Worry: Not on file    Inability: Not on file  . Transportation needs:    Medical: Not on file    Non-medical: Not on file  Tobacco Use  . Smoking status: Former Smoker    Types: Cigars  . Smokeless tobacco: Former Network engineer and Sexual Activity  . Alcohol use: Yes    Alcohol/week: 2.0 standard drinks    Types: 2 Cans of beer per week  . Drug use: No  . Sexual activity: Not on file  Lifestyle  . Physical activity:    Days per week: Not on file    Minutes per session: Not on file  . Stress: Not on file  Relationships  . Social connections:    Talks on phone: Not on file    Gets together: Not on file    Attends religious service: Not on file    Active member of club or organization: Not on file    Attends meetings of clubs or organizations: Not on file    Relationship status: Not on file   . Intimate partner violence:    Fear of current or ex partner: Not on file    Emotionally abused: Not on file    Physically abused: Not on file    Forced sexual activity: Not on file  Other Topics Concern  . Not on file  Social History Narrative  . Not on file    FAMILY HISTORY:  Family Status  Relation Name Status  . Sister  (Not Specified)  . Mother  (Not Specified)  . Sister  (Not Specified)    ROS:  Review of Systems  Unable to perform ROS: Dementia    PHYSICAL EXAMINATION:    VITALS:   There were no vitals filed for this visit.  GEN:  The patient appears stated age and is in NAD. HEENT:  Normocephalic, atraumatic.  The mucous membranes are moist. The superficial temporal arteries are without ropiness or tenderness. CV:  RRR Lungs:  CTAB Neck/HEME:  There are no carotid bruits bilaterally.  Neurological examination:  Orientation: The patient is alert and does follow simple commands and answers very simple questions, but not most questions.  Unable to perform Moca. Cranial nerves: There is good facial symmetry.  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  Could not look at pupils or do funduscopic exam as the patient squeezes the eyes tightly shut.  He has significant eyelid opening apraxia.  There is impaired upgaze.  Tried several times to look at downgaze, but he would squeeze the eyes tightly shut and could not get them open.  He is able to move the eyes horizontally bilaterally.  Speech is dysarthric.  It is hypophonic.  It lacks spontaneity.  Soft palate rises symmetrically and there is no tongue deviation. Hearing appears to be intact to conversational tone. Sensation: Sensation is intact to light touch.  He does not participate with more sensitive aspects. Motor: Strength is 5/5 in the bilateral upper and lower extremities.   Shoulder shrug is equal and symmetric.  There is no pronator drift. Deep tendon reflexes: Deep tendon reflexes are 2-/4 at the bilateral biceps, triceps,  brachioradialis, 2+ at the bilateral patella.  There is no Hoffman's or Tromner's response.  Movement examination: Tone: There is a component of gegenhalten, which makes examination difficult.  There does appear to be increased tone, albeit mild, and all 4 extremities.  The lower extremities have more increased tone in the upper extremities.  Abnormal movements: None Coordination:  There is decremation with RAM's, with all forms of rapid alternating movements, but he is also very slow to perform the tasks. Gait and Station: Did not ambulate with the patient today due to wife's report that he has significant difficulty even transferring at this point.  He was in a wheelchair.      ASSESSMENT/PLAN:  1.  Advanced PSP  -Long discussion with the patient and his wife.  We talked about nature, etiology and pathophysiology. We talked about how the symptoms, course and prognosis differ from Parkinson's Disease.  We talked about the risks, particularly for falls and aspiration.   -I will refer the patient for home PT/OT/home health aide/social work.  The goal with PT is to help with transfers, as both he and his wife have fallen in the transfers.  -The patient will have a MBE   -End-of-life issues were addressed.  Discussed level of care orders.  He wishes to be DNR.  He was very clear about that.  He was also very clear about the fact that he would never want a feeding tube.  -Patient/wife was invited to the atypical support group.  -Patient/wife were given information on support services through [facility]  -Talked to patient and his wife about consideration for alternative living situations if wife cannot continue to care for him.  -Discussed with patient and wife that I do not recommend the power wheelchair which they have apparently already gone through channels together  and are just waiting for it.  Given patient's memory change and significant eyelid opening apraxia, I do not think he is a candidate for  this.  2.  Severe cervical spinal stenosis  -Pt had MRI brain at [facility] that caught cervical spine and demonstrated severe CCS at C2-3.  Has been advised previously to f/u with neurosx.  Talked today about whether or not to repeat but given memory change and progression of PSP, not sure that he would be surgical candidate.  He made it clear that he did not want to proceed with MRI or neurosurgical consultation.  3.  Eyelid opening apraxia  -He has had Botox in the past.  This was offered again.  He denied that.  4.  Urinary incontinence  -asks about referral to urologist but doesn't want [facility].  Cannot travel out of town.  Explained that there are no other urology offices out of town.  Wife really had a misunderstanding at that office, and I recommended that they follow back up with [facility].  5. ***   Cc:  [place]

## 2019-01-18 ENCOUNTER — Ambulatory Visit: Payer: Self-pay | Admitting: Neurology

## 2019-03-19 ENCOUNTER — Encounter

## 2019-03-19 ENCOUNTER — Encounter: Payer: Self-pay | Admitting: Neurology

## 2019-03-19 ENCOUNTER — Ambulatory Visit: Payer: Self-pay | Admitting: Neurology

## 2019-07-09 IMAGING — DX DG SHOULDER 2+V*L*
3 series · 3 of 3 positions shown · non-contrast
Comparison: Shoulder radiograph 09/13/2017

CLINICAL DATA: Fall onto left shoulder.

EXAM:
LEFT SHOULDER - 2+ VIEW

[shoulder grashey]
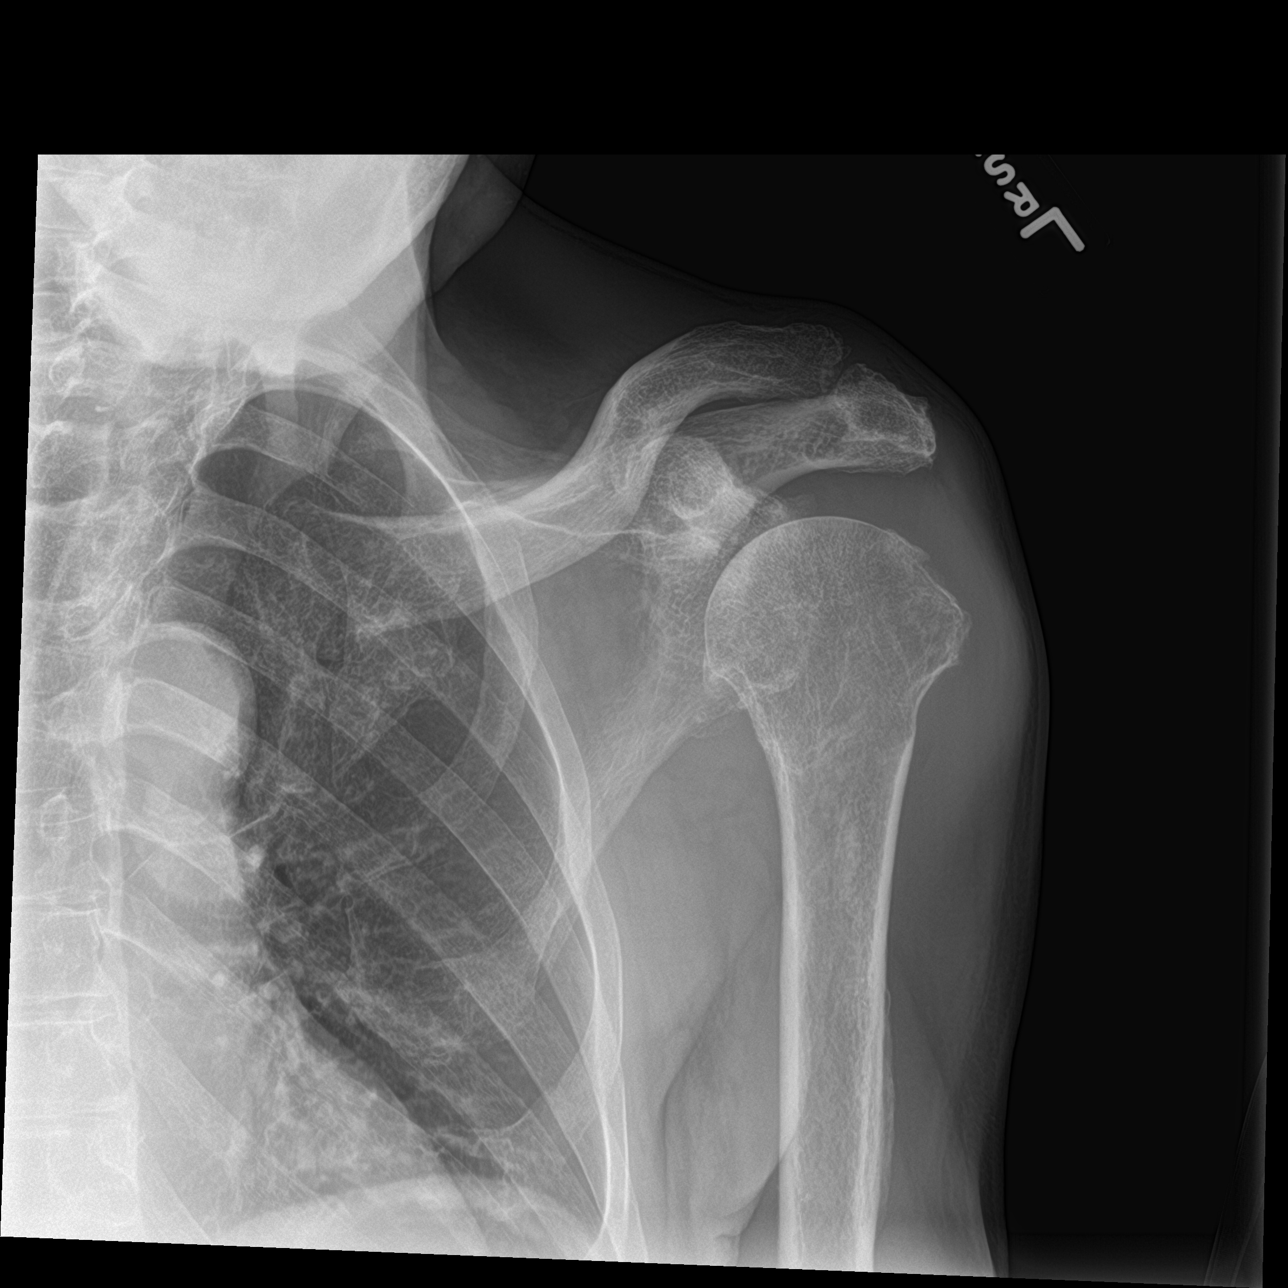

[shoulder y view]
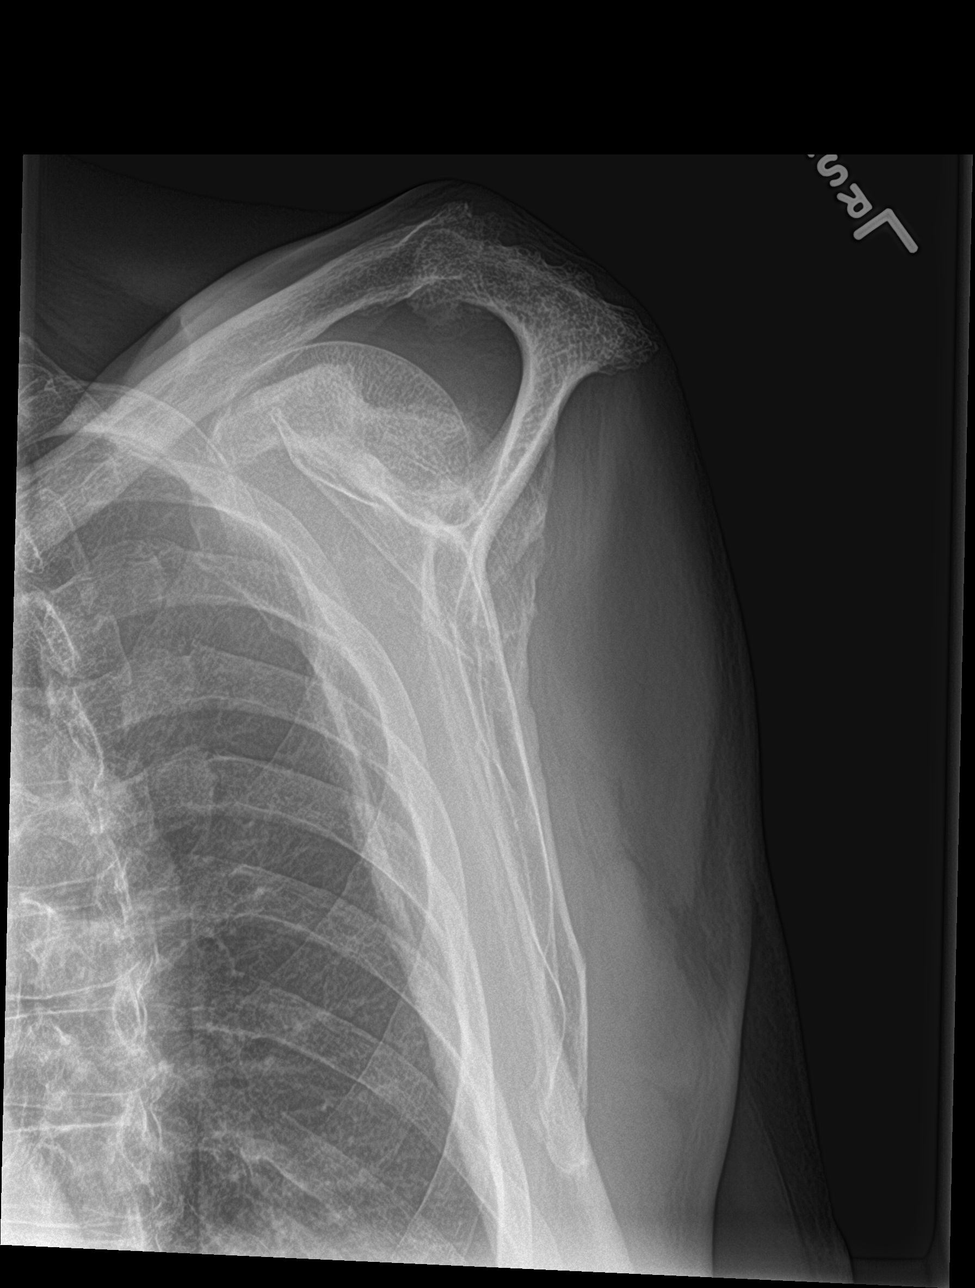

[shoulder axillary]
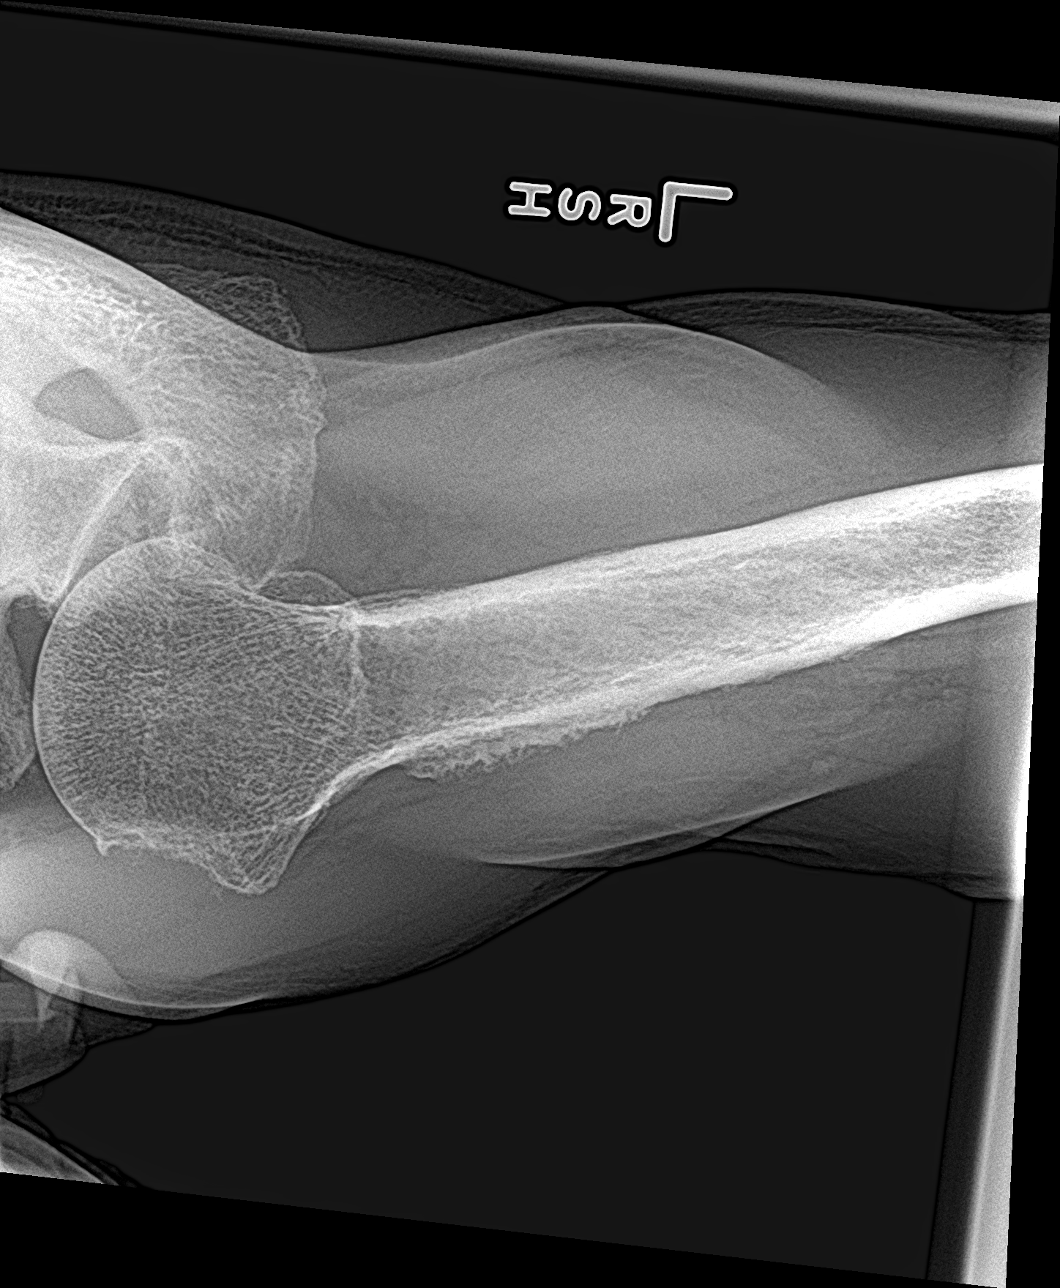

[3 of 3 positions shown; findings below may reference images not displayed]

FINDINGS: The glenohumeral joint is dislocated anteriorly. The contour of the
lateral aspect of the humeral head has changed from the prior study,
suggesting Hill-Sachs deformity. No other evidence of fracture. The
acromioclavicular joint is approximated in the clavicle is intact.
IMPRESSION: Anterior left shoulder dislocation with suspected Hill Sachs lesion.

## 2019-07-13 DEATH — deceased

## 2019-10-20 IMAGING — RF DG SWALLOWING FUNCTION
12 of 22 series · 12 of 24 positions shown · non-contrast
Comparison: None.

CLINICAL DATA: Dysphagia

EXAM:
MODIFIED BARIUM SWALLOW
TECHNIQUE: Different consistencies of barium were administered orally to the
patient by the Speech Pathologist. Imaging of the pharynx was
performed in the lateral projection.
FLUOROSCOPY TIME:  Fluoroscopy Time:  3 minutes, 8 seconds
Radiation Exposure Index (if provided by the fluoroscopic device):
N/A
Number of Acquired Spot Images: 0

[Series 2: run · 1 of 109 frames shown (1 of 12)]
[frame 17/109]
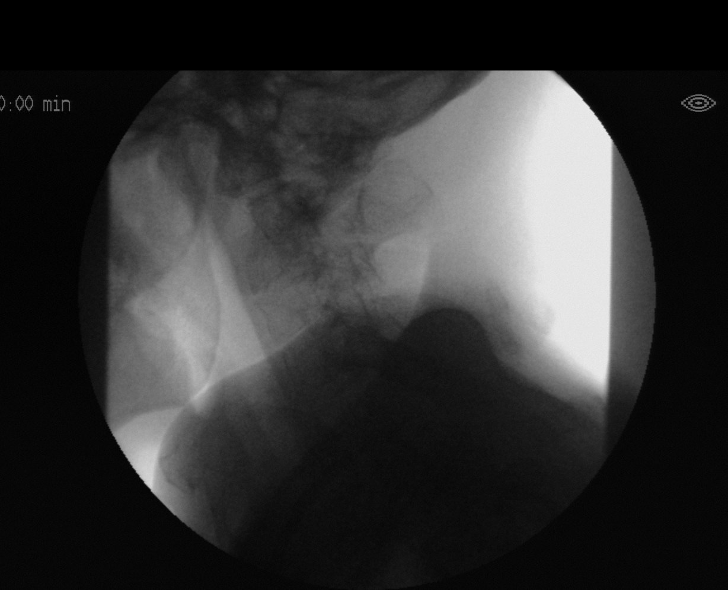

[Series 4: run · 1 of 24 frames shown (2 of 12)]
[frame 1/24]
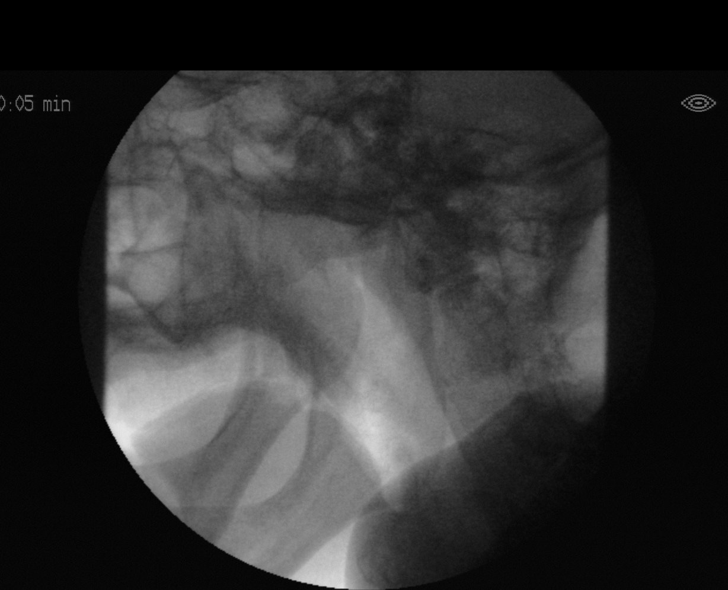

[Series 5: run · 1 of 46 frames shown (3 of 12)]
[frame 40/46]
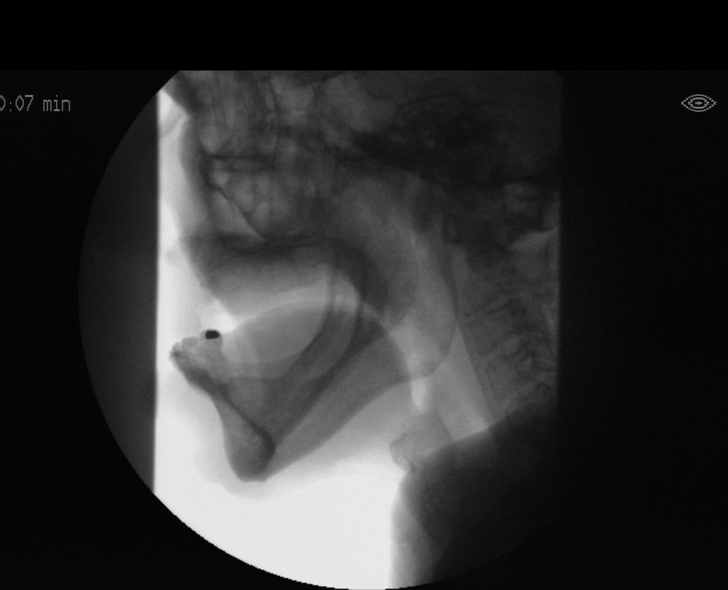

[Series 8: run · 1 of 306 frames shown (4 of 12)]
[frame 46/306]
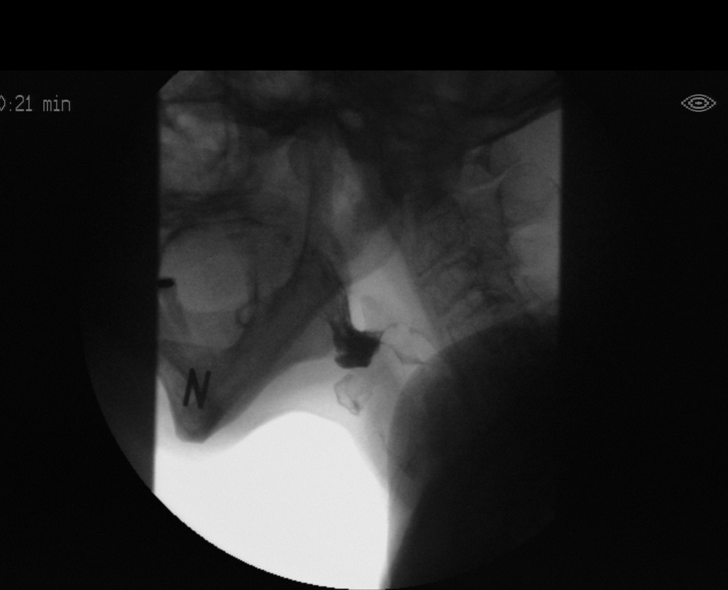

[Series 10: run · 1 of 169 frames shown (5 of 12)]
[frame 1/169]
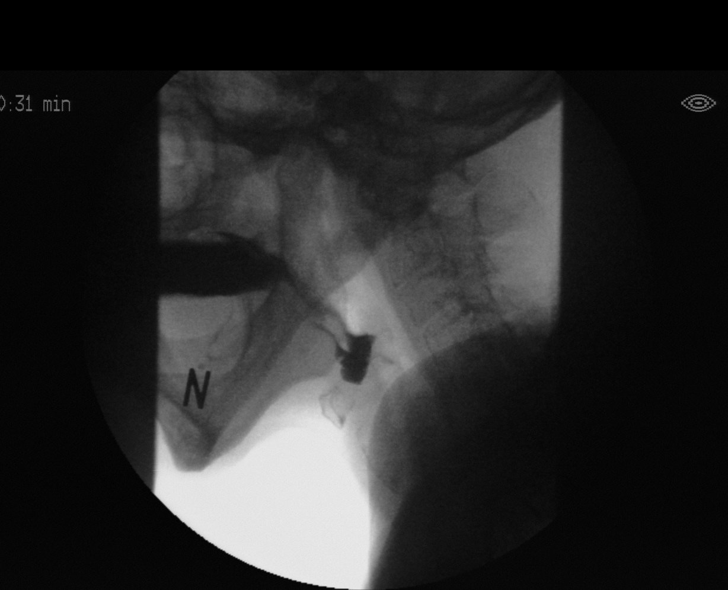

[Series 12: run · 1 of 266 frames shown (6 of 12)]
[frame 40/266]
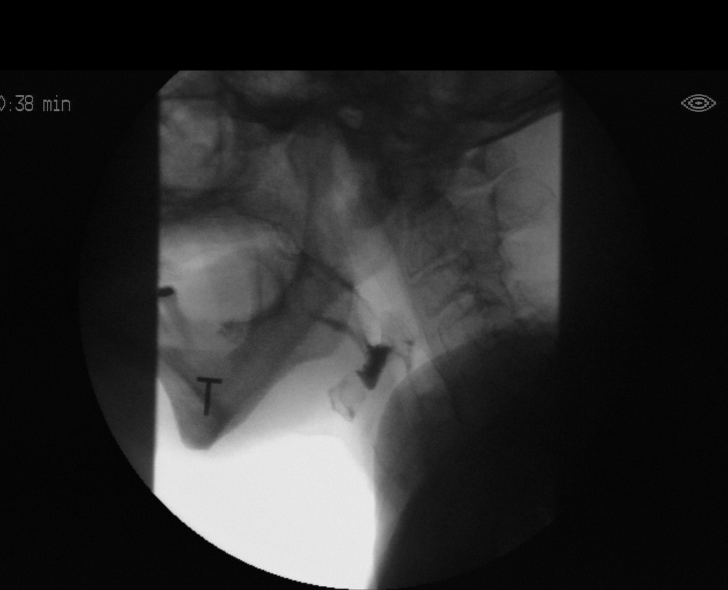

[Series 14: run · 1 of 71 frames shown (7 of 12)]
[frame 11/71]
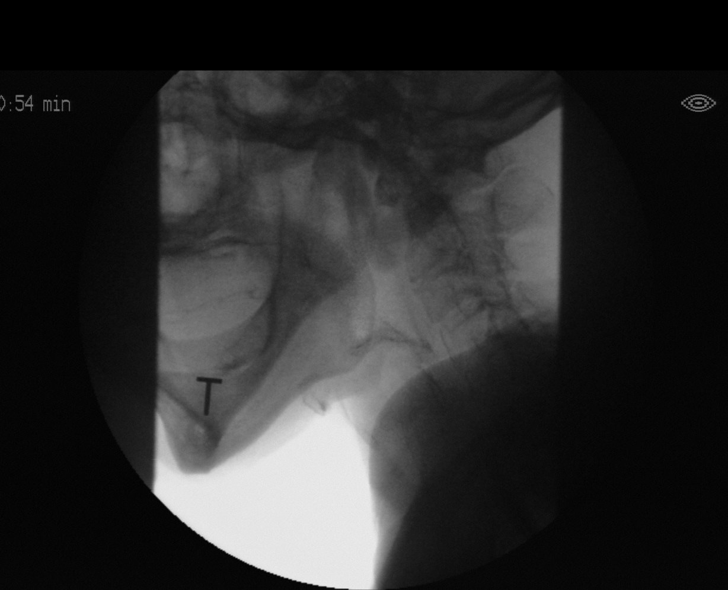

[Series 16: run · 1 of 291 frames shown (8 of 12)]
[frame 16/291]
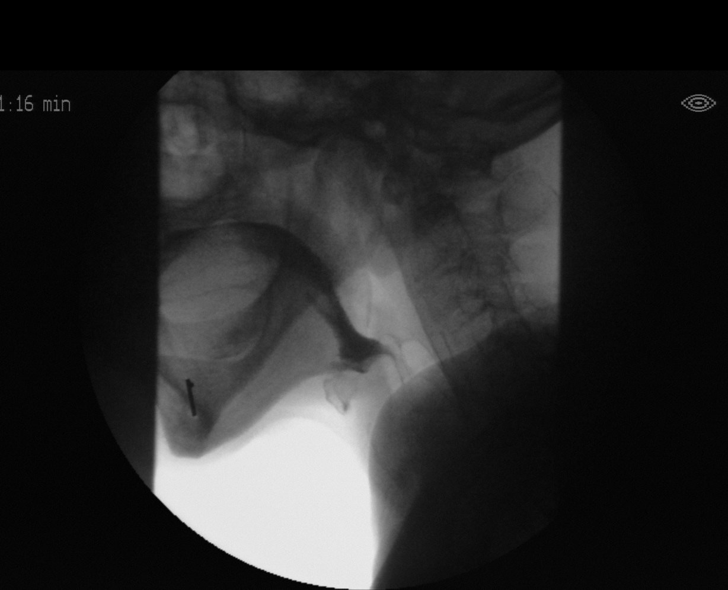

[Series 18: run · 1 of 1189 frames shown (9 of 12)]
[frame 169/1189]
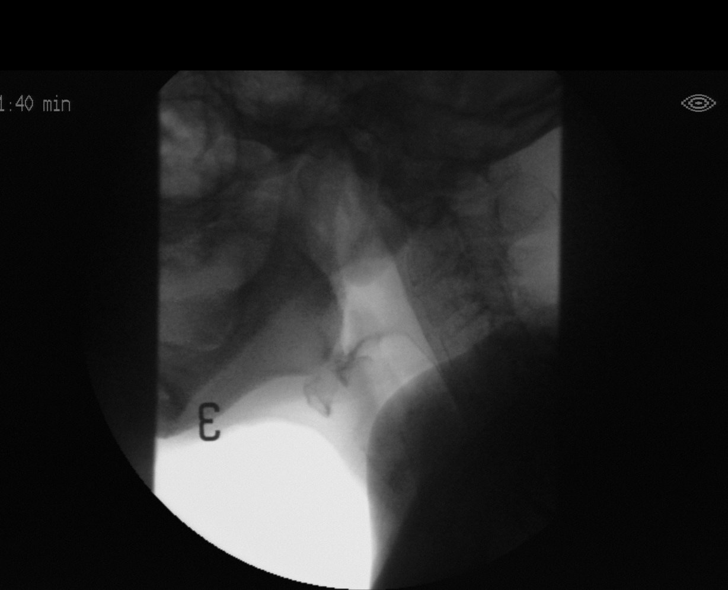

[Series 20: run · 1 of 526 frames shown (10 of 12)]
[frame 1/526]
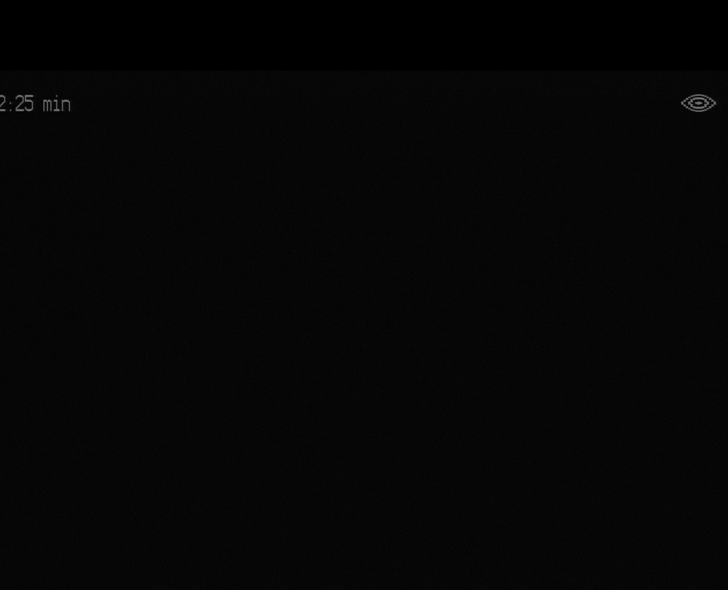

[Series 21: run · 1 of 304 frames shown (11 of 12)]
[frame 259/304]
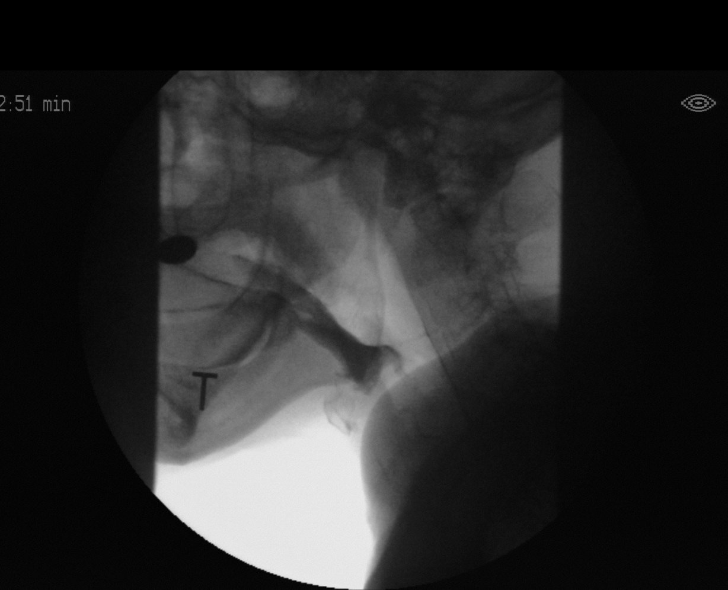

[Series 23: run · 1 of 30 frames shown (12 of 12)]
[frame 26/30]
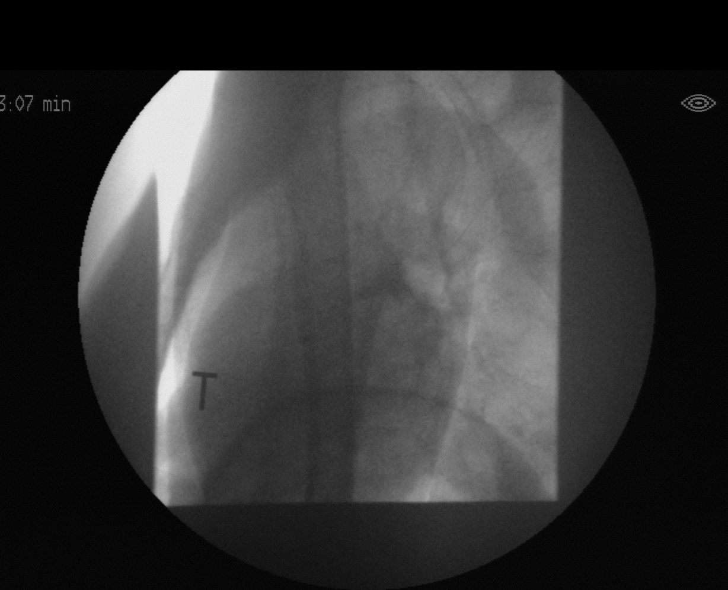

[12 of 24 positions shown; findings below may reference images not displayed]

FINDINGS: Thin liquid: Premature spill into the vallecula, delayed swallowing
trigger, and several episodes of mild flash penetration.

Nectar thick consistency: Premature spill and delayed swallow
trigger

Puree consistency: Premature spill and delayed swallow Jashari

Los cracker coated with barium: Premature spill and delayed
swallow trigger

Barium pill: Poor oral control, eventually passed
IMPRESSION: 1. Consistent premature spill into the vallecula and delayed
swallowing trigger. Several episodes of mild flash penetration with
thin liquid.

Please refer to the Speech Pathologists report for complete details
and recommendations.
